# Patient Record
Sex: Male | Born: 1950 | Race: White | Hispanic: No | Marital: Married | Smoking: Former smoker
Health system: Southern US, Community
[De-identification: ages and names within clinical notes are randomized; demographics above are authoritative.]

## PROBLEM LIST (undated history)

## (undated) DIAGNOSIS — G43909 Migraine, unspecified, not intractable, without status migrainosus: Secondary | ICD-10-CM

## (undated) DIAGNOSIS — E785 Hyperlipidemia, unspecified: Secondary | ICD-10-CM

## (undated) DIAGNOSIS — I240 Acute coronary thrombosis not resulting in myocardial infarction: Secondary | ICD-10-CM

## (undated) DIAGNOSIS — R42 Dizziness and giddiness: Secondary | ICD-10-CM

## (undated) DIAGNOSIS — M1990 Unspecified osteoarthritis, unspecified site: Secondary | ICD-10-CM

## (undated) DIAGNOSIS — K219 Gastro-esophageal reflux disease without esophagitis: Secondary | ICD-10-CM

## (undated) DIAGNOSIS — N189 Chronic kidney disease, unspecified: Secondary | ICD-10-CM

## (undated) HISTORY — DX: Acute coronary thrombosis not resulting in myocardial infarction: I24.0

## (undated) HISTORY — DX: Hyperlipidemia, unspecified: E78.5

## (undated) HISTORY — PX: EYE SURGERY: SHX253

## (undated) HISTORY — DX: Unspecified osteoarthritis, unspecified site: M19.90

## (undated) HISTORY — DX: Chronic kidney disease, unspecified: N18.9

## (undated) HISTORY — DX: Gastro-esophageal reflux disease without esophagitis: K21.9

## (undated) HISTORY — DX: Dizziness and giddiness: R42

## (undated) HISTORY — DX: Migraine, unspecified, not intractable, without status migrainosus: G43.909

## (undated) HISTORY — PX: KNEE ARTHROSCOPY: SUR90

---

## 1997-07-21 ENCOUNTER — Emergency Department (HOSPITAL_COMMUNITY): Admission: EM | Admit: 1997-07-21 | Discharge: 1997-07-21 | Payer: Self-pay | Admitting: Emergency Medicine

## 2007-11-09 ENCOUNTER — Ambulatory Visit: Payer: Self-pay | Admitting: Internal Medicine

## 2009-03-16 ENCOUNTER — Ambulatory Visit: Payer: Self-pay | Admitting: Internal Medicine

## 2009-07-03 ENCOUNTER — Ambulatory Visit: Payer: Self-pay | Admitting: Internal Medicine

## 2009-09-29 ENCOUNTER — Ambulatory Visit: Payer: Self-pay | Admitting: Internal Medicine

## 2009-10-16 ENCOUNTER — Ambulatory Visit: Payer: Self-pay | Admitting: Internal Medicine

## 2009-11-09 ENCOUNTER — Ambulatory Visit: Payer: Self-pay | Admitting: Internal Medicine

## 2009-11-14 ENCOUNTER — Ambulatory Visit: Payer: Self-pay | Admitting: Internal Medicine

## 2012-10-05 ENCOUNTER — Ambulatory Visit: Payer: Self-pay | Admitting: Internal Medicine

## 2012-10-05 DIAGNOSIS — Z0289 Encounter for other administrative examinations: Secondary | ICD-10-CM

## 2012-10-23 ENCOUNTER — Ambulatory Visit: Payer: Self-pay | Admitting: Internal Medicine

## 2015-08-21 NOTE — Progress Notes (Signed)
Erroneous encounter

## 2016-12-24 LAB — IRON AND TIBC
IRON, TOTAL/TOTAL IRON BINDING CAP: 135
Iron Saturation: 42
TIBC: 319

## 2016-12-24 LAB — BASIC METABOLIC PANEL
BUN: 17 (ref 4–21)
Creatinine: 0.9 (ref <=1.3)
Glucose: 91
POTASSIUM: 4.2 (ref 3.4–5.3)
Sodium: 138 (ref 137–147)

## 2016-12-24 LAB — CBC AND DIFFERENTIAL
HCT: 47 (ref 41–53)
HEMOGLOBIN: 16.2 (ref 13.5–17.5)
Neutrophils Absolute: 3978
PLATELETS: 245 (ref 150–399)
WBC: 6

## 2016-12-24 LAB — TSH: TSH: 2.65 (ref <=5.90)

## 2016-12-24 LAB — HEPATIC FUNCTION PANEL
ALT: 21 (ref 10–40)
AST: 25 (ref 14–40)
Alkaline Phosphatase: 61 (ref 25–125)
Bilirubin, Total: 0.7

## 2016-12-24 LAB — LIPID PANEL
CHOLESTEROL: 225 — AB (ref 0–200)
HDL: 36 (ref 35–70)
LDL Cholesterol: 151
TRIGLYCERIDES: 250 — AB (ref 40–160)

## 2016-12-24 LAB — VITAMIN D 25 HYDROXY (VIT D DEFICIENCY, FRACTURES): Vit D, 25-Hydroxy: 20

## 2016-12-24 LAB — TESTOSTERONE TOTAL,FREE,BIO, MALES: TESTOSTERONE: 379

## 2016-12-24 LAB — HEMOGLOBIN A1C: Hemoglobin A1C: 5.3

## 2016-12-24 LAB — PSA: PSA: 2

## 2017-02-10 ENCOUNTER — Encounter: Payer: Self-pay | Admitting: Podiatry

## 2017-02-10 ENCOUNTER — Ambulatory Visit: Payer: BC Managed Care – PPO | Admitting: Podiatry

## 2017-02-10 ENCOUNTER — Ambulatory Visit (INDEPENDENT_AMBULATORY_CARE_PROVIDER_SITE_OTHER): Payer: BC Managed Care – PPO

## 2017-02-10 ENCOUNTER — Other Ambulatory Visit: Payer: Self-pay | Admitting: Podiatry

## 2017-02-10 VITALS — BP 135/82 | HR 71

## 2017-02-10 DIAGNOSIS — M779 Enthesopathy, unspecified: Secondary | ICD-10-CM | POA: Diagnosis not present

## 2017-02-10 DIAGNOSIS — M205X1 Other deformities of toe(s) (acquired), right foot: Secondary | ICD-10-CM

## 2017-02-10 DIAGNOSIS — R52 Pain, unspecified: Secondary | ICD-10-CM

## 2017-02-10 NOTE — Patient Instructions (Signed)
Start by changing your shoes. You can go to [employer] or [employer] to look at shoes. If this does not help we can add a custom insert in your shoes. We can do that for you in our office. If you get swelling or increase in pain that we can consider a steroid injection to help.   It was nice to meet you today. If you have any questions please give us a call at [phone]  Have a good week!

## 2017-02-10 NOTE — Progress Notes (Signed)
Subjective:    Patient ID: Martin Norris, male    DOB: 09-30-1950, 67 y.o.   MRN: [id number]  HPI 67 year old male presents the office today for concerns of a possible bunion to the right toe is been ongoing for about 2 years.  He states the pain is intermittent and is worse he does a lot of walking or standing.  He states that he cannot bend his toe back like he can his left foot.  He states that he may have broken it several years ago but no recent injury.  No numbness or tingling.  He does not there is any significant swelling or redness.  He has no other concerns today.   Review of Systems  All other systems reviewed and are negative.  No past medical history on file.     Current Outpatient Medications:  .  atorvastatin (LIPITOR) 10 MG tablet, , Disp: , Rfl: 3 .  Ibuprofen (ADVIL PO), Take by mouth 3 (three) times daily as needed., Disp: , Rfl:  .  Vitamin D, Ergocalciferol, (DRISDOL) 50000 units CAPS capsule, , Disp: , Rfl: 3  Allergies  Allergen Reactions  . Codeine     Anxious,intolerence,nervous,mainly cold medicine    Social History   Socioeconomic History  . Marital status: Single    Spouse name: Not on file  . Number of children: Not on file  . Years of education: Not on file  . Highest education level: Not on file  Social Needs  . Financial resource strain: Not on file  . Food insecurity - worry: Not on file  . Food insecurity - inability: Not on file  . Transportation needs - medical: Not on file  . Transportation needs - non-medical: Not on file  Occupational History  . Not on file  Tobacco Use  . Smoking status: Former Games developer  . Smokeless tobacco: Never Used  . Tobacco comment: quit 27 years ago  Substance and Sexual Activity  . Alcohol use: No    Frequency: Never  . Drug use: No  . Sexual activity: Not on file  Other Topics Concern  . Not on file  Social History Narrative  . Not on file        Objective:   Physical Exam  General: AAO  x3, NAD  Dermatological: Skin is warm, dry and supple bilateral. Nails x 10 are well manicured; remaining integument appears unremarkable at this time. There are no open sores, no preulcerative lesions, no rash or signs of infection present.  Vascular: Dorsalis Pedis artery and Posterior Tibial artery pedal pulses are 2/4 bilateral with immedate capillary fill time. Pedal hair growth present.  There is no pain with calf compression, swelling, warmth, erythema.   Neruologic: Grossly intact via light touch bilateral. Protective threshold with Semmes Wienstein monofilament intact to all pedal sites bilateral.   Musculoskeletal: There is decreased range of motion of the right first MTPJ and there is a dorsal bone spur present of the first metatarsal head.  There is trace edema to the MPJ but there is no erythema or increase in warmth.  There is no pain in the second metatarsal other areas of the foot or ankle.  Ankle, subtalar joint range of motion intact.  Muscular strength 5/5 in all groups tested bilateral.  Gait: Unassisted, Nonantalgic.     Assessment & Plan:  67 year old male hallux limitus, capsulitis right first MPJ -Treatment options discussed including all alternatives, risks, and complications -Etiology of symptoms were discussed -X-rays were  obtained and reviewed with the patient.  Arthritic changes present the first MTPJ with a dorsal osteophyte.  There is no evidence of acute fracture identified. -We discussed both conservative as well as surgical treatment options.  After discussion we will continue with conservative treatment.  We discussed a steroid injection he declined and I agree with this as he has minimal discomfort today.  Discussed with him a change in shoes in a more supportive, stiffer soled shoe.  We discussed a custom orthotic to help offload the symptoms.  He can start by changing his shoe is a very flexible old shoe.  If this is not helpful and start with an  insert.  Vivi BarrackMatthew R  DPM

## 2017-02-11 DIAGNOSIS — M205X1 Other deformities of toe(s) (acquired), right foot: Secondary | ICD-10-CM | POA: Insufficient documentation

## 2017-05-21 NOTE — Progress Notes (Signed)
Subjective:    Patient ID: Martin Norris, male    DOB: [birth year], 67 y.o.   MRN: [id number]  HPI:  Martin Norris is here to establish as a new pt.  Martin Norris is a pleasant 67 year old male.  PMH: HLD.  Martin Norris was previously on statin and after 20 days of therapy Martin Norris developed severe myalgia and short term memory loss. Martin Norris denies tobacco use Martin Norris denies excessive ETOH use Martin Norris is recently retired and plans on "farming most of my own food soon" Martin Norris denies cardiac/resp disease His problem list has CKD, however 12/24/16 GFR 91, creat 0.89  Patient Care Team    Relationship Specialty Notifications Start End  Julaine Fusi, NP PCP - General Family Medicine  05/22/17     Patient Active Problem List   Diagnosis Date Noted  . Healthcare maintenance 05/22/2017  . Hyperlipidemia 05/22/2017  . Hallux limitus of right foot 02/11/2017     Past Medical History:  Diagnosis Date  . Chronic kidney disease    KIDNEY STONES  . Hyperlipidemia      Past Surgical History:  Procedure Laterality Date  . KNEE ARTHROSCOPY Left      Family History  Problem Relation Age of Onset  . Healthy Mother   . Heart disease Father   . Stroke Father   . Hyperlipidemia Father   . COPD Sister   . Hypertension Brother   . Hyperlipidemia Brother   . Hypertension Sister   . Hyperlipidemia Brother      Social History   Substance and Sexual Activity  Drug Use Yes  . Types: Marijuana   Comment: CASUALLY     Social History   Substance and Sexual Activity  Alcohol Use Yes  . Frequency: Never   Comment: RARELY     Social History   Tobacco Use  Smoking Status Former Smoker  . Packs/day: 2.00  . Years: 30.00  . Pack years: 60.00  . Types: Cigarettes  . Last attempt to quit: 01/28/1989  . Years since quitting: 28.3  Smokeless Tobacco Never Used     Outpatient Encounter Medications as of 05/22/2017  Medication Sig  . Ibuprofen (ADVIL PO) Take by mouth 3 (three) times daily as needed.  . Red Yeast Rice  Extract 600 MG CAPS Take 2 capsules by mouth daily.  . Turmeric 500 MG CAPS Take 2 capsules by mouth daily.  . Vitamin D, Ergocalciferol, (DRISDOL) 50000 units CAPS capsule Take 50,000 Units by mouth every 7 (seven) days.   Marland Kitchen omega-3 acid ethyl esters (LOVAZA) 1 g capsule Take 2 capsules (2 g total) by mouth 2 (two) times daily.  . [DISCONTINUED] atorvastatin (LIPITOR) 10 MG tablet    No facility-administered encounter medications on file as of 05/22/2017.     Allergies: Codeine and Statins  Body mass index is 39.33 kg/m.  Blood pressure (!) 144/80, pulse 63, height 5' 5.5" (1.664 m), weight 240 lb (108.9 kg), SpO2 98 %.   Patient Care Team    Relationship Specialty Notifications Start End  Julaine Fusi, NP PCP - General Family Medicine  05/22/17     Patient Active Problem List   Diagnosis Date Noted  . Healthcare maintenance 05/22/2017  . Hyperlipidemia 05/22/2017  . Hallux limitus of right foot 02/11/2017     Past Medical History:  Diagnosis Date  . Chronic kidney disease    KIDNEY STONES  . Hyperlipidemia      Past Surgical History:  Procedure Laterality Date  .  KNEE ARTHROSCOPY Left      Family History  Problem Relation Age of Onset  . Healthy Mother   . Heart disease Father   . Stroke Father   . Hyperlipidemia Father   . COPD Sister   . Hypertension Brother   . Hyperlipidemia Brother   . Hypertension Sister   . Hyperlipidemia Brother      Social History   Substance and Sexual Activity  Drug Use Yes  . Types: Marijuana   Comment: CASUALLY     Social History   Substance and Sexual Activity  Alcohol Use Yes  . Frequency: Never   Comment: RARELY     Social History   Tobacco Use  Smoking Status Former Smoker  . Packs/day: 2.00  . Years: 30.00  . Pack years: 60.00  . Types: Cigarettes  . Last attempt to quit: 01/28/1989  . Years since quitting: 28.3  Smokeless Tobacco Never Used     Outpatient Encounter Medications as of  05/22/2017  Medication Sig  . Ibuprofen (ADVIL PO) Take by mouth 3 (three) times daily as needed.  . Red Yeast Rice Extract 600 MG CAPS Take 2 capsules by mouth daily.  . Turmeric 500 MG CAPS Take 2 capsules by mouth daily.  . Vitamin D, Ergocalciferol, (DRISDOL) 50000 units CAPS capsule Take 50,000 Units by mouth every 7 (seven) days.   Marland Kitchen omega-3 acid ethyl esters (LOVAZA) 1 g capsule Take 2 capsules (2 g total) by mouth 2 (two) times daily.  . [DISCONTINUED] atorvastatin (LIPITOR) 10 MG tablet    No facility-administered encounter medications on file as of 05/22/2017.     Allergies: Codeine and Statins  Body mass index is 39.33 kg/m.  Blood pressure (!) 144/80, pulse 63, height 5' 5.5" (1.664 m), weight 240 lb (108.9 kg), SpO2 98 %.      Review of Systems  Constitutional: Positive for fatigue. Negative for activity change, appetite change, chills, diaphoresis, fever and unexpected weight change.  Eyes: Negative for visual disturbance.  Respiratory: Negative for cough, chest tightness, shortness of breath, wheezing and stridor.   Cardiovascular: Negative for chest pain, palpitations and leg swelling.  Gastrointestinal: Negative for abdominal distention, abdominal pain, blood in stool, constipation, diarrhea, nausea and vomiting.  Endocrine: Negative for cold intolerance, heat intolerance, polydipsia, polyphagia and polyuria.  Genitourinary: Negative for difficulty urinating and frequency.  Musculoskeletal: Negative for arthralgias, back pain, gait problem, joint swelling, myalgias, neck pain and neck stiffness.  Neurological: Negative for dizziness and headaches.  Hematological: Does not bruise/bleed easily.  Psychiatric/Behavioral: Negative for dysphoric mood, hallucinations, self-injury, sleep disturbance and suicidal ideas. The patient is not nervous/anxious and is not hyperactive.       Objective:   Physical Exam  Constitutional: Martin Norris is oriented to person, place, and time.  Martin Norris appears well-developed and well-nourished. No distress.  HENT:  Head: Normocephalic and atraumatic.  Right Ear: External ear normal.  Eyes: Pupils are equal, round, and reactive to light. Conjunctivae are normal.  Neck: Normal range of motion. Neck supple.  Neurological: Martin Norris is alert and oriented to person, place, and time.  Skin: Skin is warm and dry. No rash noted. Martin Norris is not diaphoretic. No erythema. No pallor.  Psychiatric: Martin Norris has a normal mood and affect. His behavior is normal. Judgment and thought content normal.  Nursing note and vitals reviewed.         Assessment & Plan:   1. Need for Tdap vaccination   2. Screening for colon cancer  3. Healthcare maintenance   4. Mixed hyperlipidemia      Healthcare maintenance  Please start Lovaza, take as directed. Follow Mediterranean Diet and increase regular exercise.  Recommend at least 30 minutes daily, 5 days per week of walking, jogging, biking, swimming, YouTube/Pinterest workout videos, house/yard work. Continue Tumeric and Red Yeast Rice. Please schedule fasting lab appt in 5 months, then complete physical a week or so afterwards. Order for Colonoscopy placed.  Hyperlipidemia 12/24/16 Lipid Panel- Tot 225 TGs 250 HDL 36 LDL 151 Martin Norris is statin intolerant. Started on Lovaza BID Follow Mediterranean Diet and increase regular movement/exercise Will recheck lipids at end of summer- Martin Norris can normalize numbers with TLC    FOLLOW-UP:  Return in about 5 months (around 10/22/2017) for Fasting Labs, CPE.

## 2017-05-22 ENCOUNTER — Encounter: Payer: Self-pay | Admitting: Adult Health

## 2017-05-22 ENCOUNTER — Ambulatory Visit: Payer: BC Managed Care – PPO | Admitting: Adult Health

## 2017-05-22 VITALS — BP 144/80 | HR 63 | Ht 65.5 in | Wt 240.0 lb

## 2017-05-22 DIAGNOSIS — Z0000 Encounter for general adult medical examination without abnormal findings: Secondary | ICD-10-CM | POA: Diagnosis not present

## 2017-05-22 DIAGNOSIS — Z1211 Encounter for screening for malignant neoplasm of colon: Secondary | ICD-10-CM | POA: Diagnosis not present

## 2017-05-22 DIAGNOSIS — Z23 Encounter for immunization: Secondary | ICD-10-CM | POA: Diagnosis not present

## 2017-05-22 DIAGNOSIS — E785 Hyperlipidemia, unspecified: Secondary | ICD-10-CM | POA: Insufficient documentation

## 2017-05-22 DIAGNOSIS — E782 Mixed hyperlipidemia: Secondary | ICD-10-CM

## 2017-05-22 MED ORDER — OMEGA-3-ACID ETHYL ESTERS 1 G PO CAPS: 2.0000 g | ORAL_CAPSULE | Freq: Two times a day (BID) | ORAL | 2 refills | Status: DC

## 2017-05-22 NOTE — Assessment & Plan Note (Signed)
12/24/16 Lipid Panel- Tot 225 TGs 250 HDL 36 LDL 151 He is statin intolerant. Started on Lovaza BID Follow Mediterranean Diet and increase regular movement/exercise Will recheck lipids at end of summer- he can normalize numbers with TLC

## 2017-05-22 NOTE — Assessment & Plan Note (Signed)
  Please start Lovaza, take as directed. Follow Mediterranean Diet and increase regular exercise.  Recommend at least 30 minutes daily, 5 days per week of walking, jogging, biking, swimming, YouTube/Pinterest workout videos, house/yard work. Continue Tumeric and Red Yeast Rice. Please schedule fasting lab appt in 5 months, then complete physical a week or so afterwards. Order for Colonoscopy placed.

## 2017-05-22 NOTE — Patient Instructions (Signed)
Mediterranean Diet A Mediterranean diet refers to food and lifestyle choices that are based on the traditions of countries located on the [place]. This way of eating has been shown to help prevent certain conditions and improve outcomes for people who have chronic diseases, like kidney disease and heart disease. What are tips for following this plan? Lifestyle  Cook and eat meals together with your family, when possible.  Drink enough fluid to keep your urine clear or pale yellow.  Be physically active every day. This includes: ? Aerobic exercise like running or swimming. ? Leisure activities like gardening, walking, or housework.  Get 7-8 hours of sleep each night.  If recommended by your health care provider, drink red wine in moderation. This means 1 glass a day for nonpregnant women and 2 glasses a day for men. A glass of wine equals 5 oz (150 mL). Reading food labels  Check the serving size of packaged foods. For foods such as rice and pasta, the serving size refers to the amount of cooked product, not dry.  Check the total fat in packaged foods. Avoid foods that have saturated fat or trans fats.  Check the ingredients list for added sugars, such as corn syrup. Shopping  At the grocery store, buy most of your food from the areas near the walls of the store. This includes: ? Fresh fruits and vegetables (produce). ? Grains, beans, nuts, and seeds. Some of these may be available in unpackaged forms or large amounts (in bulk). ? Fresh seafood. ? Poultry and eggs. ? Low-fat dairy products.  Buy whole ingredients instead of prepackaged foods.  Buy fresh fruits and vegetables in-season from local farmers markets.  Buy frozen fruits and vegetables in resealable bags.  If you do not have access to quality fresh seafood, buy precooked frozen shrimp or canned fish, such as tuna, salmon, or sardines.  Buy small amounts of raw or cooked vegetables, salads, or olives from the  deli or salad bar at your store.  Stock your pantry so you always have certain foods on hand, such as olive oil, canned tuna, canned tomatoes, rice, pasta, and beans. Cooking  Cook foods with extra-virgin olive oil instead of using butter or other vegetable oils.  Have meat as a side dish, and have vegetables or grains as your main dish. This means having meat in small portions or adding small amounts of meat to foods like pasta or stew.  Use beans or vegetables instead of meat in common dishes like chili or lasagna.  Experiment with different cooking methods. Try roasting or broiling vegetables instead of steaming or sauteing them.  Add frozen vegetables to soups, stews, pasta, or rice.  Add nuts or seeds for added healthy fat at each meal. You can add these to yogurt, salads, or vegetable dishes.  Marinate fish or vegetables using olive oil, lemon juice, garlic, and fresh herbs. Meal planning  Plan to eat 1 vegetarian meal one day each week. Try to work up to 2 vegetarian meals, if possible.  Eat seafood 2 or more times a week.  Have healthy snacks readily available, such as: ? Vegetable sticks with hummus. ? Greek yogurt. ? Fruit and nut trail mix.  Eat balanced meals throughout the week. This includes: ? Fruit: 2-3 servings a day ? Vegetables: 4-5 servings a day ? Low-fat dairy: 2 servings a day ? Fish, poultry, or lean meat: 1 serving a day ? Beans and legumes: 2 or more servings a week ? Nuts   and seeds: 1-2 servings a day ? Whole grains: 6-8 servings a day ? Extra-virgin olive oil: 3-4 servings a day  Limit red meat and sweets to only a few servings a month What are my food choices?  Mediterranean diet ? Recommended ? Grains: Whole-grain pasta. Brown rice. Bulgar wheat. Polenta. Couscous. Whole-wheat bread. [place]. ? Vegetables: Artichokes. Beets. Broccoli. Cabbage. Carrots. Eggplant. Green beans. Chard. Kale. Spinach. Onions. Leeks. Peas. Squash.  Tomatoes. Peppers. Radishes. ? Fruits: Apples. Apricots. Avocado. Berries. Bananas. Cherries. Dates. Figs. Grapes. Lemons. Melon. Oranges. Peaches. Plums. Pomegranate. ? Meats and other protein foods: Beans. Almonds. Sunflower seeds. Pine nuts. Peanuts. [place]. Salmon. Scallops. Shrimp. [place]. Tilapia. Clams. Oysters. Eggs. ? Dairy: Low-fat milk. Cheese. Greek yogurt. ? Beverages: Water. Red wine. Herbal tea. ? Fats and oils: Extra virgin olive oil. Avocado oil. Grape seed oil. ? Sweets and desserts: [place] yogurt with honey. Baked apples. Poached pears. Trail mix. ? Seasoning and other foods: Basil. Cilantro. Coriander. Cumin. Mint. Parsley. Sage. Rosemary. Tarragon. Garlic. Oregano. Thyme. Pepper. Balsalmic vinegar. Tahini. Hummus. Tomato sauce. Olives. Mushrooms. ? Limit these ? Grains: Prepackaged pasta or rice dishes. Prepackaged cereal with added sugar. ? Vegetables: Deep fried potatoes (french fries). ? Fruits: Fruit canned in syrup. ? Meats and other protein foods: Beef. Pork. Lamb. Poultry with skin. Hot dogs. Berniece Salines. ? Dairy: Ice cream. Sour cream. Whole milk. ? Beverages: Juice. Sugar-sweetened soft drinks. Beer. Liquor and spirits. ? Fats and oils: Butter. Canola oil. Vegetable oil. Beef fat (tallow). Lard. ? Sweets and desserts: Cookies. Cakes. Pies. Candy. ? Seasoning and other foods: Mayonnaise. Premade sauces and marinades. ? The items listed may not be a complete list. Talk with your dietitian about what dietary choices are right for you. Summary  The Mediterranean diet includes both food and lifestyle choices.  Eat a variety of fresh fruits and vegetables, beans, nuts, seeds, and whole grains.  Limit the amount of red meat and sweets that you eat.  Talk with your health care provider about whether it is safe for you to drink red wine in moderation. This means 1 glass a day for nonpregnant women and 2 glasses a day for men. A glass of wine equals 5 oz (150 mL). This information  is not intended to replace advice given to you by your health care provider. Make sure you discuss any questions you have with your health care provider. Document Released: 09/07/2015 Document Revised: 10/10/2015 Document Reviewed: 09/07/2015 Elsevier Interactive Patient Education  2018 [employer].   Exercising to [employer] Exercising can help you to lose weight. In order to lose weight through exercise, you need to do vigorous-intensity exercise. You can tell that you are exercising with vigorous intensity if you are breathing very hard and fast and cannot hold a conversation while exercising. Moderate-intensity exercise helps to maintain your current weight. You can tell that you are exercising at a moderate level if you have a higher heart rate and faster breathing, but you are still able to hold a conversation. How often should I exercise? Choose an activity that you enjoy and set realistic goals. Your health care provider can help you to make an activity plan that works for you. Exercise regularly as directed by your health care provider. This may include:  Doing resistance training twice each week, such as: ? Push-ups. ? Sit-ups. ? Lifting weights. ? Using resistance bands.  Doing a given intensity of exercise for a given amount of time. Choose from these options: ? 150  minutes of moderate-intensity exercise every week. ? 75 minutes of vigorous-intensity exercise every week. ? A mix of moderate-intensity and vigorous-intensity exercise every week.  Children, pregnant women, people who are out of shape, people who are overweight, and older adults may need to consult a health care provider for individual recommendations. If you have any sort of medical condition, be sure to consult your health care provider before starting a new exercise program. What are some activities that can help me to lose weight?  Walking at a rate of at least 4.5 miles an hour.  Jogging or running at a  rate of 5 miles per hour.  Biking at a rate of at least 10 miles per hour.  Lap swimming.  Roller-skating or in-line skating.  Cross-country skiing.  Vigorous competitive sports, such as football, basketball, and soccer.  Jumping rope.  Aerobic dancing. How can I be more active in my day-to-day activities?  Use the stairs instead of the elevator.  Take a walk during your lunch break.  If you drive, park your car farther away from work or school.  If you take public transportation, get off one stop early and walk the rest of the way.  Make all of your phone calls while standing up and walking around.  Get up, stretch, and walk around every 30 minutes throughout the day. What guidelines should I follow while exercising?  Do not exercise so much that you hurt yourself, feel dizzy, or get very short of breath.  Consult your health care provider prior to starting a new exercise program.  Wear comfortable clothes and shoes with good support.  Drink plenty of water while you exercise to prevent dehydration or heat stroke. Body water is lost during exercise and must be replaced.  Work out until you breathe faster and your heart beats faster. This information is not intended to replace advice given to you by your health care provider. Make sure you discuss any questions you have with your health care provider. Document Released: 02/16/2010 Document Revised: 06/22/2015 Document Reviewed: 06/17/2013 Elsevier Interactive Patient Education  2018 [employer]   Please start Lovaza, take as directed. Follow Mediterranean Diet and increase regular exercise.  Recommend at least 30 minutes daily, 5 days per week of walking, jogging, biking, swimming, YouTube/Pinterest workout videos, house/yard work. Continue Tumeric and Red Yeast Rice. Please schedule fasting lab appt in 5 months, then complete physical a week or so afterwards. Order for Colonoscopy placed. WELCOME TO THE  PRACTICE!

## 2017-06-02 NOTE — Progress Notes (Signed)
Opened in error. T. Nelson, CMA 

## 2017-06-24 ENCOUNTER — Encounter: Payer: Self-pay | Admitting: Adult Health

## 2017-10-21 NOTE — Progress Notes (Signed)
Subjective:    Patient ID: Martin Norris, male    DOB: 04/20/1950, 67 y.o.   MRN: [id number]  HPI: 05/22/17 OV:  Martin Norris is here to establish as a new pt.  He is a pleasant 67 year old male.  PMH: HLD.  He was previously on statin and after 20 days of therapy he developed severe myalgia and short term memory loss. He denies tobacco use He denies excessive ETOH use He is recently retired and plans on "farming most of my own food soon" He denies cardiac/resp disease His problem list has CKD, however 12/24/16 GFR 91, creat 0.89  10/23/17 OV: Martin Norris is here for CPE He remains off statin due to myalgia's, and has been increasing daily walking and reducing saturated fat and CHO in diet to naturally lower lipids. He continue to abstain from tobacco and seldom consumes ETOH He is enjoying retirement and denies any acute complaints today.  Healthcare Maintenance: Colonoscopy-referral to GI has been made Immunizations-UTD LDCT-N/A AAA Screening-N/A  Patient Care Team    Relationship Specialty Notifications Start End  Julaine Fusi, NP PCP - General Family Medicine  05/22/17     Patient Active Problem List   Diagnosis Date Noted  . Healthcare maintenance 05/22/2017  . Hyperlipidemia 05/22/2017  . Hallux limitus of right foot 02/11/2017     Past Medical History:  Diagnosis Date  . Chronic kidney disease    KIDNEY STONES  . Hyperlipidemia      Past Surgical History:  Procedure Laterality Date  . KNEE ARTHROSCOPY Left      Family History  Problem Relation Age of Onset  . Healthy Mother   . Heart disease Father   . Stroke Father   . Hyperlipidemia Father   . COPD Sister   . Hypertension Brother   . Hyperlipidemia Brother   . Hypertension Sister   . Hyperlipidemia Brother      Social History   Substance and Sexual Activity  Drug Use Yes  . Types: Marijuana   Comment: CASUALLY     Social History   Substance and Sexual Activity  Alcohol Use Yes  .  Frequency: Never   Comment: RARELY     Social History   Tobacco Use  Smoking Status Former Smoker  . Packs/day: 2.00  . Years: 30.00  . Pack years: 60.00  . Types: Cigarettes  . Last attempt to quit: 01/28/1989  . Years since quitting: 28.7  Smokeless Tobacco Never Used     Outpatient Encounter Medications as of 10/23/2017  Medication Sig  . Ibuprofen (ADVIL PO) Take by mouth 3 (three) times daily as needed.  Marland Kitchen omega-3 acid ethyl esters (LOVAZA) 1 g capsule Take 2 capsules (2 g total) by mouth 2 (two) times daily.  . Red Yeast Rice Extract 600 MG CAPS Take 2 capsules by mouth daily.  . Turmeric 500 MG CAPS Take 2 capsules by mouth daily.  . [DISCONTINUED] Vitamin D, Ergocalciferol, (DRISDOL) 50000 units CAPS capsule Take 50,000 Units by mouth every 7 (seven) days.    No facility-administered encounter medications on file as of 10/23/2017.     Allergies: Codeine and Statins  Body mass index is 39.48 kg/m.  Blood pressure 127/81, pulse 61, height 5' 5.5" (1.664 m), weight 240 lb 14.4 oz (109.3 kg), SpO2 96 %.   Patient Care Team    Relationship Specialty Notifications Start End  William Hamburger D, NP PCP - General Family Medicine  05/22/17  Patient Active Problem List   Diagnosis Date Noted  . Healthcare maintenance 05/22/2017  . Hyperlipidemia 05/22/2017  . Hallux limitus of right foot 02/11/2017     Past Medical History:  Diagnosis Date  . Chronic kidney disease    KIDNEY STONES  . Hyperlipidemia      Past Surgical History:  Procedure Laterality Date  . KNEE ARTHROSCOPY Left      Family History  Problem Relation Age of Onset  . Healthy Mother   . Heart disease Father   . Stroke Father   . Hyperlipidemia Father   . COPD Sister   . Hypertension Brother   . Hyperlipidemia Brother   . Hypertension Sister   . Hyperlipidemia Brother      Social History   Substance and Sexual Activity  Drug Use Yes  . Types: Marijuana   Comment: CASUALLY      Social History   Substance and Sexual Activity  Alcohol Use Yes  . Frequency: Never   Comment: RARELY     Social History   Tobacco Use  Smoking Status Former Smoker  . Packs/day: 2.00  . Years: 30.00  . Pack years: 60.00  . Types: Cigarettes  . Last attempt to quit: 01/28/1989  . Years since quitting: 28.7  Smokeless Tobacco Never Used     Outpatient Encounter Medications as of 10/23/2017  Medication Sig  . Ibuprofen (ADVIL PO) Take by mouth 3 (three) times daily as needed.  Marland Kitchen. omega-3 acid ethyl esters (LOVAZA) 1 g capsule Take 2 capsules (2 g total) by mouth 2 (two) times daily.  . Red Yeast Rice Extract 600 MG CAPS Take 2 capsules by mouth daily.  . Turmeric 500 MG CAPS Take 2 capsules by mouth daily.  . [DISCONTINUED] Vitamin D, Ergocalciferol, (DRISDOL) 50000 units CAPS capsule Take 50,000 Units by mouth every 7 (seven) days.    No facility-administered encounter medications on file as of 10/23/2017.     Allergies: Codeine and Statins  Body mass index is 39.48 kg/m.  Blood pressure 127/81, pulse 61, height 5' 5.5" (1.664 m), weight 240 lb 14.4 oz (109.3 kg), SpO2 96 %.  Review of Systems  Constitutional: Positive for fatigue. Negative for activity change, appetite change, chills, diaphoresis, fever and unexpected weight change.  Eyes: Negative for visual disturbance.  Respiratory: Negative for cough, chest tightness, shortness of breath, wheezing and stridor.   Cardiovascular: Negative for chest pain, palpitations and leg swelling.  Gastrointestinal: Negative for abdominal distention, abdominal pain, blood in stool, constipation, diarrhea, nausea and vomiting.  Endocrine: Negative for cold intolerance, heat intolerance, polydipsia, polyphagia and polyuria.  Genitourinary: Negative for difficulty urinating and frequency.  Musculoskeletal: Negative for arthralgias, back pain, gait problem, joint swelling, myalgias, neck pain and neck stiffness.  Neurological:  Negative for dizziness and headaches.  Hematological: Does not bruise/bleed easily.  Psychiatric/Behavioral: Negative for dysphoric mood, hallucinations, self-injury, sleep disturbance and suicidal ideas. The patient is not nervous/anxious and is not hyperactive.       Objective:   Physical Exam  Constitutional: He is oriented to person, place, and time. He appears well-developed and well-nourished. No distress.  HENT:  Head: Normocephalic and atraumatic.  Right Ear: Hearing, tympanic membrane, external ear and ear canal normal. Tympanic membrane is not erythematous and not bulging. No decreased hearing is noted.  Left Ear: Hearing, tympanic membrane, external ear and ear canal normal. Tympanic membrane is not erythematous and not bulging. No decreased hearing is noted.  Nose: No mucosal edema or  rhinorrhea. Right sinus exhibits no maxillary sinus tenderness and no frontal sinus tenderness. Left sinus exhibits no maxillary sinus tenderness and no frontal sinus tenderness.  Mouth/Throat: Uvula is midline and oropharynx is clear and moist.  Eyes: Pupils are equal, round, and reactive to light. Conjunctivae are normal.  Neck: Normal range of motion. Neck supple.  Cardiovascular: Normal rate, regular rhythm, normal heart sounds and intact distal pulses.  No murmur heard. Pulmonary/Chest: Effort normal. No respiratory distress. He has no wheezes. He has no rales. He exhibits no tenderness.  Abdominal: Soft. Bowel sounds are normal. He exhibits no distension and no mass. There is no tenderness. There is no rebound and no guarding.  Genitourinary:  Genitourinary Comments: Declined DRE/genital exam- had one 6 months ago it was normal per pt  Musculoskeletal: Normal range of motion. He exhibits no edema or tenderness.  Lymphadenopathy:    He has no cervical adenopathy.  Neurological: He is alert and oriented to person, place, and time. Coordination normal.  Skin: Skin is warm and dry. No rash noted.  He is not diaphoretic. No erythema. No pallor.  Psychiatric: He has a normal mood and affect. His behavior is normal. Judgment and thought content normal.  Nursing note and vitals reviewed.     Assessment & Plan:   1. Need for hepatitis C screening test   2. Healthcare maintenance   3. Mixed hyperlipidemia      Healthcare maintenance Continue to drink plenty of water and follow Mediterranean diet. We will call you when lab results are available. Please schedule your Colonoscopy soon. Recommend follow-up in 6 months, sooner if needed.   Hyperlipidemia Lipids drawn today He was previously on statin and after 20 days of therapy he developed severe myalgia and short term memory loss.    FOLLOW-UP:  Return in about 6 months (around 04/23/2018) for Regular Follow Up.

## 2017-10-23 ENCOUNTER — Encounter: Payer: Self-pay | Admitting: Adult Health

## 2017-10-23 ENCOUNTER — Ambulatory Visit (INDEPENDENT_AMBULATORY_CARE_PROVIDER_SITE_OTHER): Payer: BC Managed Care – PPO | Admitting: Adult Health

## 2017-10-23 VITALS — BP 127/81 | HR 61 | Ht 65.5 in | Wt 240.9 lb

## 2017-10-23 DIAGNOSIS — Z0000 Encounter for general adult medical examination without abnormal findings: Secondary | ICD-10-CM

## 2017-10-23 DIAGNOSIS — Z1159 Encounter for screening for other viral diseases: Secondary | ICD-10-CM

## 2017-10-23 DIAGNOSIS — E782 Mixed hyperlipidemia: Secondary | ICD-10-CM | POA: Diagnosis not present

## 2017-10-23 NOTE — Patient Instructions (Signed)
Preventive Care for Adults, Male A healthy lifestyle and preventive care can promote health and wellness. Preventive health guidelines for men include the following key practices:  A routine yearly physical is a good way to check with your health care provider about your health and preventative screening. It is a chance to share any concerns and updates on your health and to receive a thorough exam.  Visit your dentist for a routine exam and preventative care every 6 months. Brush your teeth twice a day and floss once a day. Good oral hygiene prevents tooth decay and gum disease.  The frequency of eye exams is based on your age, health, family medical history, use of contact lenses, and other factors. Follow your health care provider's recommendations for frequency of eye exams.  Eat a healthy diet. Foods such as vegetables, fruits, whole grains, low-fat dairy products, and lean protein foods contain the nutrients you need without too many calories. Decrease your intake of foods high in solid fats, added sugars, and salt. Eat the right amount of calories for you.Get information about a proper diet from your health care provider, if necessary.  Regular physical exercise is one of the most important things you can do for your health. Most adults should get at least 150 minutes of moderate-intensity exercise (any activity that increases your heart rate and causes you to sweat) each week. In addition, most adults need muscle-strengthening exercises on 2 or more days a week.  Maintain a healthy weight. The body mass index (BMI) is a screening tool to identify possible weight problems. It provides an estimate of body fat based on height and weight. Your health care provider can find your BMI and can help you achieve or maintain a healthy weight.For adults 20 years and older:  A BMI below 18.5 is considered underweight.  A BMI of 18.5 to 24.9 is normal.  A BMI of 25 to 29.9 is considered  overweight.  A BMI of 30 and above is considered obese.  Maintain normal blood lipids and cholesterol levels by exercising and minimizing your intake of saturated fat. Eat a balanced diet with plenty of fruit and vegetables. Blood tests for lipids and cholesterol should begin at age 22 and be repeated every 5 years. If your lipid or cholesterol levels are high, you are over 50, or you are at high risk for heart disease, you may need your cholesterol levels checked more frequently.Ongoing high lipid and cholesterol levels should be treated with medicines if diet and exercise are not working.  If you smoke, find out from your health care provider how to quit. If you do not use tobacco, do not start.  Lung cancer screening is recommended for adults aged 87-80 years who are at high risk for developing lung cancer because of a history of smoking. A yearly low-dose CT scan of the lungs is recommended for people who have at least a 30-pack-year history of smoking and are a current smoker or have quit within the past 15 years. A pack year of smoking is smoking an average of 1 pack of cigarettes a day for 1 year (for example: 1 pack a day for 30 years or 2 packs a day for 15 years). Yearly screening should continue until the smoker has stopped smoking for at least 15 years. Yearly screening should be stopped for people who develop a health problem that would prevent them from having lung cancer treatment.  If you choose to drink alcohol, do not have more  than 2 drinks per day. One drink is considered to be 12 ounces (355 mL) of beer, 5 ounces (148 mL) of wine, or 1.5 ounces (44 mL) of liquor.  Avoid use of street drugs. Do not share needles with anyone. Ask for help if you need support or instructions about stopping the use of drugs.  High blood pressure causes heart disease and increases the risk of stroke. Your blood pressure should be checked at least every 1-2 years. Ongoing high blood pressure should be  treated with medicines, if weight loss and exercise are not effective.  If you are 34-90 years old, ask your health care provider if you should take aspirin to prevent heart disease.  Diabetes screening is done by taking a blood sample to check your blood glucose level after you have not eaten for a certain period of time (fasting). If you are not overweight and you do not have risk factors for diabetes, you should be screened once every 3 years starting at age 35. If you are overweight or obese and you are 70-84 years of age, you should be screened for diabetes every year as part of your cardiovascular risk assessment.  Colorectal cancer can be detected and often prevented. Most routine colorectal cancer screening begins at the age of 18 and continues through age 69. However, your health care provider may recommend screening at an earlier age if you have risk factors for colon cancer. On a yearly basis, your health care provider may provide home test kits to check for hidden blood in the stool. Use of a small camera at the end of a tube to directly examine the colon (sigmoidoscopy or colonoscopy) can detect the earliest forms of colorectal cancer. Talk to your health care provider about this at age 71, when routine screening begins. Direct exam of the colon should be repeated every 5-10 years through age 18, unless early forms of precancerous polyps or small growths are found.  People who are at an increased risk for hepatitis B should be screened for this virus. You are considered at high risk for hepatitis B if:  You were born in a country where hepatitis B occurs often. Talk with your health care provider about which countries are considered high risk.  Your parents were born in a high-risk country and you have not received a shot to protect against hepatitis B (hepatitis B vaccine).  You have HIV or AIDS.  You use needles to inject street drugs.  You live with, or have sex with, someone who  has hepatitis B.  You are a man who has sex with other men (MSM).  You get hemodialysis treatment.  You take certain medicines for conditions such as cancer, organ transplantation, and autoimmune conditions.  Hepatitis C blood testing is recommended for all people born from [birth year] through 1965 and any individual with known risks for hepatitis C.  Practice safe sex. Use condoms and avoid high-risk sexual practices to reduce the spread of sexually transmitted infections (STIs). STIs include gonorrhea, chlamydia, syphilis, trichomonas, herpes, HPV, and human immunodeficiency virus (HIV). Herpes, HIV, and HPV are viral illnesses that have no cure. They can result in disability, cancer, and death.  If you are a man who has sex with other men, you should be screened at least once per year for:  HIV.  Urethral, rectal, and pharyngeal infection of gonorrhea, chlamydia, or both.  If you are at risk of being infected with HIV, it is recommended that you take a  prescription medicine daily to prevent HIV infection. This is called preexposure prophylaxis (PrEP). You are considered at risk if:  You are a man who has sex with other men (MSM) and have other risk factors.  You are a heterosexual man, are sexually active, and are at increased risk for HIV infection.  You take drugs by injection.  You are sexually active with a partner who has HIV.  Talk with your health care provider about whether you are at high risk of being infected with HIV. If you choose to begin PrEP, you should first be tested for HIV. You should then be tested every 3 months for as long as you are taking PrEP.  A one-time screening for abdominal aortic aneurysm (AAA) and surgical repair of large AAAs by ultrasound are recommended for men ages 44 to 66 years who are current or former smokers.  Healthy men should no longer receive prostate-specific antigen (PSA) blood tests as part of routine cancer screening. Talk with your health  care provider about prostate cancer screening.  Testicular cancer screening is not recommended for adult males who have no symptoms. Screening includes self-exam, a health care provider exam, and other screening tests. Consult with your health care provider about any symptoms you have or any concerns you have about testicular cancer.  Use sunscreen. Apply sunscreen liberally and repeatedly throughout the day. You should seek shade when your shadow is shorter than you. Protect yourself by wearing long sleeves, pants, a wide-brimmed hat, and sunglasses year round, whenever you are outdoors.  Once a month, do a whole-body skin exam, using a mirror to look at the skin on your back. Tell your health care provider about new moles, moles that have irregular borders, moles that are larger than a pencil eraser, or moles that have changed in shape or color.  Stay current with required vaccines (immunizations).  Influenza vaccine. All adults should be immunized every year.  Tetanus, diphtheria, and acellular pertussis (Td, Tdap) vaccine. An adult who has not previously received Tdap or who does not know his vaccine status should receive 1 dose of Tdap. This initial dose should be followed by tetanus and diphtheria toxoids (Td) booster doses every 10 years. Adults with an unknown or incomplete history of completing a 3-dose immunization series with Td-containing vaccines should begin or complete a primary immunization series including a Tdap dose. Adults should receive a Td booster every 10 years.  Varicella vaccine. An adult without evidence of immunity to varicella should receive 2 doses or a second dose if he has previously received 1 dose.  Human papillomavirus (HPV) vaccine. Males aged 11-21 years who have not received the vaccine previously should receive the 3-dose series. Males aged 22-26 years may be immunized. Immunization is recommended through the age of 23 years for any male who has sex with males  and did not get any or all doses earlier. Immunization is recommended for any person with an immunocompromised condition through the age of 72 years if he did not get any or all doses earlier. During the 3-dose series, the second dose should be obtained 4-8 weeks after the first dose. The third dose should be obtained 24 weeks after the first dose and 16 weeks after the second dose.  Zoster vaccine. One dose is recommended for adults aged 23 years or older unless certain conditions are present.  Measles, mumps, and rubella (MMR) vaccine. Adults born before [birth year] generally are considered immune to measles and mumps. Adults born in [birth year]  or later should have 1 or more doses of MMR vaccine unless there is a contraindication to the vaccine or there is laboratory evidence of immunity to each of the three diseases. A routine second dose of MMR vaccine should be obtained at least 28 days after the first dose for students attending postsecondary schools, health care workers, or international travelers. People who received inactivated measles vaccine or an unknown type of measles vaccine during 1963-1967 should receive 2 doses of MMR vaccine. People who received inactivated mumps vaccine or an unknown type of mumps vaccine before 1979 and are at high risk for mumps infection should consider immunization with 2 doses of MMR vaccine. Unvaccinated health care workers born before [birth year] who lack laboratory evidence of measles, mumps, or rubella immunity or laboratory confirmation of disease should consider measles and mumps immunization with 2 doses of MMR vaccine or rubella immunization with 1 dose of MMR vaccine.  Pneumococcal 13-valent conjugate (PCV13) vaccine. When indicated, a person who is uncertain of his immunization history and has no record of immunization should receive the PCV13 vaccine. All adults 9 years of age and older should receive this vaccine. An adult aged 69 years or older who has certain medical  conditions and has not been previously immunized should receive 1 dose of PCV13 vaccine. This PCV13 should be followed with a dose of pneumococcal polysaccharide (PPSV23) vaccine. Adults who are at high risk for pneumococcal disease should obtain the PPSV23 vaccine at least 8 weeks after the dose of PCV13 vaccine. Adults older than 65 years of age who have normal immune system function should obtain the PPSV23 vaccine dose at least 1 year after the dose of PCV13 vaccine.  Pneumococcal polysaccharide (PPSV23) vaccine. When PCV13 is also indicated, PCV13 should be obtained first. All adults aged 79 years and older should be immunized. An adult younger than age 43 years who has certain medical conditions should be immunized. Any person who resides in a nursing home or long-term care facility should be immunized. An adult smoker should be immunized. People with an immunocompromised condition and certain other conditions should receive both PCV13 and PPSV23 vaccines. People with human immunodeficiency virus (HIV) infection should be immunized as soon as possible after diagnosis. Immunization during chemotherapy or radiation therapy should be avoided. Routine use of PPSV23 vaccine is not recommended for American Indians, [place] Natives, or people younger than 65 years unless there are medical conditions that require PPSV23 vaccine. When indicated, people who have unknown immunization and have no record of immunization should receive PPSV23 vaccine. One-time revaccination 5 years after the first dose of PPSV23 is recommended for people aged 19-64 years who have chronic kidney failure, nephrotic syndrome, asplenia, or immunocompromised conditions. People who received 1-2 doses of PPSV23 before age 70 years should receive another dose of PPSV23 vaccine at age 79 years or later if at least 5 years have passed since the previous dose. Doses of PPSV23 are not needed for people immunized with PPSV23 at or after age 55  years.  Meningococcal vaccine. Adults with asplenia or persistent complement component deficiencies should receive 2 doses of quadrivalent meningococcal conjugate (MenACWY-D) vaccine. The doses should be obtained at least 2 months apart. Microbiologists working with certain meningococcal bacteria, [employer] recruits, people at risk during an outbreak, and people who travel to or live in countries with a high rate of meningitis should be immunized. A first-year college student up through age 64 years who is living in a residence hall should receive a  dose if he did not receive a dose on or after his 16th birthday. Adults who have certain high-risk conditions should receive one or more doses of vaccine.  Hepatitis A vaccine. Adults who wish to be protected from this disease, have chronic liver disease, work with hepatitis A-infected animals, work in hepatitis A research labs, or travel to or work in countries with a high rate of hepatitis A should be immunized. Adults who were previously unvaccinated and who anticipate close contact with an international adoptee during the first 60 days after arrival in the [place] from a country with a high rate of hepatitis A should be immunized.  Hepatitis B vaccine. Adults should be immunized if they wish to be protected from this disease, are under age 67 years and have diabetes, have chronic liver disease, have had more than one sex partner in the past 6 months, may be exposed to blood or other infectious body fluids, are household contacts or sex partners of hepatitis B positive people, are clients or workers in certain care facilities, or travel to or work in countries with a high rate of hepatitis B.  Haemophilus influenzae type b (Hib) vaccine. A previously unvaccinated person with asplenia or sickle cell disease or having a scheduled splenectomy should receive 1 dose of Hib vaccine. Regardless of previous immunization, a recipient of a hematopoietic stem cell  transplant should receive a 3-dose series 6-12 months after his successful transplant. Hib vaccine is not recommended for adults with HIV infection. Preventive Service / Frequency Ages 38 to 56  Blood pressure check.** / Every 3-5 years.  Lipid and cholesterol check.** / Every 5 years beginning at age 65.  Hepatitis C blood test.** / For any individual with known risks for hepatitis C.  Skin self-exam. / Monthly.  Influenza vaccine. / Every year.  Tetanus, diphtheria, and acellular pertussis (Tdap, Td) vaccine.** / Consult your health care provider. 1 dose of Td every 10 years.  Varicella vaccine.** / Consult your health care provider.  HPV vaccine. / 3 doses over 6 months, if 46 or younger.  Measles, mumps, rubella (MMR) vaccine.** / You need at least 1 dose of MMR if you were born in 1957 or later. You may also need a second dose.  Pneumococcal 13-valent conjugate (PCV13) vaccine.** / Consult your health care provider.  Pneumococcal polysaccharide (PPSV23) vaccine.** / 1 to 2 doses if you smoke cigarettes or if you have certain conditions.  Meningococcal vaccine.** / 1 dose if you are age 3 to 51 years and a Market researcher living in a residence hall, or have one of several medical conditions. You may also need additional booster doses.  Hepatitis A vaccine.** / Consult your health care provider.  Hepatitis B vaccine.** / Consult your health care provider.  Haemophilus influenzae type b (Hib) vaccine.** / Consult your health care provider. Ages 40 to 40  Blood pressure check.** / Every year.  Lipid and cholesterol check.** / Every 5 years beginning at age 66.  Lung cancer screening. / Every year if you are aged 62-80 years and have a 30-pack-year history of smoking and currently smoke or have quit within the past 15 years. Yearly screening is stopped once you have quit smoking for at least 15 years or develop a health problem that would prevent you from having  lung cancer treatment.  Fecal occult blood test (FOBT) of stool. / Every year beginning at age 85 and continuing until age 68. You may not have to do  this test if you get a colonoscopy every 10 years.  Flexible sigmoidoscopy** or colonoscopy.** / Every 5 years for a flexible sigmoidoscopy or every 10 years for a colonoscopy beginning at age 51 and continuing until age 64.  Hepatitis C blood test.** / For all people born from [birth year] through 1965 and any individual with known risks for hepatitis C.  Skin self-exam. / Monthly.  Influenza vaccine. / Every year.  Tetanus, diphtheria, and acellular pertussis (Tdap/Td) vaccine.** / Consult your health care provider. 1 dose of Td every 10 years.  Varicella vaccine.** / Consult your health care provider.  Zoster vaccine.** / 1 dose for adults aged 70 years or older.  Measles, mumps, rubella (MMR) vaccine.** / You need at least 1 dose of MMR if you were born in 1957 or later. You may also need a second dose.  Pneumococcal 13-valent conjugate (PCV13) vaccine.** / Consult your health care provider.  Pneumococcal polysaccharide (PPSV23) vaccine.** / 1 to 2 doses if you smoke cigarettes or if you have certain conditions.  Meningococcal vaccine.** / Consult your health care provider.  Hepatitis A vaccine.** / Consult your health care provider.  Hepatitis B vaccine.** / Consult your health care provider.  Haemophilus influenzae type b (Hib) vaccine.** / Consult your health care provider. Ages 43 and over  Blood pressure check.** / Every year.  Lipid and cholesterol check.**/ Every 5 years beginning at age 6.  Lung cancer screening. / Every year if you are aged 54-80 years and have a 30-pack-year history of smoking and currently smoke or have quit within the past 15 years. Yearly screening is stopped once you have quit smoking for at least 15 years or develop a health problem that would prevent you from having lung cancer treatment.  Fecal  occult blood test (FOBT) of stool. / Every year beginning at age 77 and continuing until age 32. You may not have to do this test if you get a colonoscopy every 10 years.  Flexible sigmoidoscopy** or colonoscopy.** / Every 5 years for a flexible sigmoidoscopy or every 10 years for a colonoscopy beginning at age 8 and continuing until age 60.  Hepatitis C blood test.** / For all people born from [birth year] through 1963 and any individual with known risks for hepatitis C.  Abdominal aortic aneurysm (AAA) screening.** / A one-time screening for ages 60 to 11 years who are current or former smokers.  Skin self-exam. / Monthly.  Influenza vaccine. / Every year.  Tetanus, diphtheria, and acellular pertussis (Tdap/Td) vaccine.** / 1 dose of Td every 10 years.  Varicella vaccine.** / Consult your health care provider.  Zoster vaccine.** / 1 dose for adults aged 9 years or older.  Pneumococcal 13-valent conjugate (PCV13) vaccine.** / 1 dose for all adults aged 91 years and older.  Pneumococcal polysaccharide (PPSV23) vaccine.** / 1 dose for all adults aged 96 years and older.  Meningococcal vaccine.** / Consult your health care provider.  Hepatitis A vaccine.** / Consult your health care provider.  Hepatitis B vaccine.** / Consult your health care provider.  Haemophilus influenzae type b (Hib) vaccine.** / Consult your health care provider. **Family history and personal history of risk and conditions may change your health care provider's recommendations.   This information is not intended to replace advice given to you by your health care provider. Make sure you discuss any questions you have with your health care provider.   Document Released: 03/12/2001 Document Revised: 02/04/2014 Document Reviewed: 06/11/2010 Elsevier Interactive Patient Education 2016  [place] refers to food and lifestyle choices that are based on the traditions of  countries located on the [employer]. This way of eating has been shown to help prevent certain conditions and improve outcomes for people who have chronic diseases, like kidney disease and heart disease. What are tips for following this plan? Lifestyle  Cook and eat meals together with your family, when possible.  Drink enough fluid to keep your urine clear or pale yellow.  Be physically active every day. This includes: ? Aerobic exercise like running or swimming. ? Leisure activities like gardening, walking, or housework.  Get 7-8 hours of sleep each night.  If recommended by your health care provider, drink red wine in moderation. This means 1 glass a day for nonpregnant women and 2 glasses a day for men. A glass of wine equals 5 oz (150 mL). Reading food labels  Check the serving size of packaged foods. For foods such as rice and pasta, the serving size refers to the amount of cooked product, not dry.  Check the total fat in packaged foods. Avoid foods that have saturated fat or trans fats.  Check the ingredients list for added sugars, such as corn syrup. Shopping  At the grocery store, buy most of your food from the areas near the walls of the store. This includes: ? Fresh fruits and vegetables (produce). ? Grains, beans, nuts, and seeds. Some of these may be available in unpackaged forms or large amounts (in bulk). ? Fresh seafood. ? Poultry and eggs. ? Low-fat dairy products.  Buy whole ingredients instead of prepackaged foods.  Buy fresh fruits and vegetables in-season from local farmers markets.  Buy frozen fruits and vegetables in resealable bags.  If you do not have access to quality fresh seafood, buy precooked frozen shrimp or canned fish, such as tuna, salmon, or sardines.  Buy small amounts of raw or cooked vegetables, salads, or olives from the deli or salad bar at your store.  Stock your pantry so you always have certain foods on hand, such as olive  oil, canned tuna, canned tomatoes, rice, pasta, and beans. Cooking  Cook foods with extra-virgin olive oil instead of using butter or other vegetable oils.  Have meat as a side dish, and have vegetables or grains as your main dish. This means having meat in small portions or adding small amounts of meat to foods like pasta or stew.  Use beans or vegetables instead of meat in common dishes like chili or lasagna.  Experiment with different cooking methods. Try roasting or broiling vegetables instead of steaming or sauteing them.  Add frozen vegetables to soups, stews, pasta, or rice.  Add nuts or seeds for added healthy fat at each meal. You can add these to yogurt, salads, or vegetable dishes.  Marinate fish or vegetables using olive oil, lemon juice, garlic, and fresh herbs. Meal planning  Plan to eat 1 vegetarian meal one day each week. Try to work up to 2 vegetarian meals, if possible.  Eat seafood 2 or more times a week.  Have healthy snacks readily available, such as: ? Vegetable sticks with hummus. ? [place] yogurt. ? Fruit and nut trail mix.  Eat balanced meals throughout the week. This includes: ? Fruit: 2-3 servings a day ? Vegetables: 4-5 servings a day ? Low-fat dairy: 2 servings a day ? Fish, poultry, or lean meat: 1 serving a day ? Beans and legumes: 2 or more  servings a week ? Nuts and seeds: 1-2 servings a day ? Whole grains: 6-8 servings a day ? Extra-virgin olive oil: 3-4 servings a day  Limit red meat and sweets to only a few servings a month What are my food choices?  Mediterranean diet ? Recommended ? Grains: Whole-grain pasta. Brown rice. Bulgar wheat. Polenta. Couscous. Whole-wheat bread. [place]. ? Vegetables: Artichokes. Beets. Broccoli. Cabbage. Carrots. Eggplant. Green beans. Chard. Kale. Spinach. Onions. Leeks. Peas. Squash. Tomatoes. Peppers. Radishes. ? Fruits: Apples. Apricots. Avocado. Berries. Bananas. Cherries. Dates. Figs. Grapes.  Lemons. Melon. Oranges. Peaches. Plums. Pomegranate. ? Meats and other protein foods: Beans. Almonds. Sunflower seeds. Pine nuts. Peanuts. Garden. Salmon. Scallops. Shrimp. [place]. Tilapia. Clams. Oysters. Eggs. ? Dairy: Low-fat milk. Cheese. Greek yogurt. ? Beverages: Water. Red wine. Herbal tea. ? Fats and oils: Extra virgin olive oil. Avocado oil. Grape seed oil. ? Sweets and desserts: [place] yogurt with honey. Baked apples. Poached pears. Trail mix. ? Seasoning and other foods: Basil. Cilantro. Coriander. Cumin. Mint. Parsley. Sage. Rosemary. Tarragon. Garlic. Oregano. Thyme. Pepper. Balsalmic vinegar. Tahini. Hummus. Tomato sauce. Olives. Mushrooms. ? Limit these ? Grains: Prepackaged pasta or rice dishes. Prepackaged cereal with added sugar. ? Vegetables: Deep fried potatoes (french fries). ? Fruits: Fruit canned in syrup. ? Meats and other protein foods: Beef. Pork. Lamb. Poultry with skin. Hot dogs. [place] Salines. ? Dairy: Ice cream. Sour cream. Whole milk. ? Beverages: Juice. Sugar-sweetened soft drinks. Beer. Liquor and spirits. ? Fats and oils: Butter. Canola oil. Vegetable oil. Beef fat (tallow). Lard. ? Sweets and desserts: Cookies. Cakes. Pies. Candy. ? Seasoning and other foods: Mayonnaise. Premade sauces and marinades. ? The items listed may not be a complete list. Talk with your dietitian about what dietary choices are right for you. Summary  The Mediterranean diet includes both food and lifestyle choices.  Eat a variety of fresh fruits and vegetables, beans, nuts, seeds, and whole grains.  Limit the amount of red meat and sweets that you eat.  Talk with your health care provider about whether it is safe for you to drink red wine in moderation. This means 1 glass a day for nonpregnant women and 2 glasses a day for men. A glass of wine equals 5 oz (150 mL). This information is not intended to replace advice given to you by your health care provider. Make sure you discuss any questions  you have with your health care provider. Document Released: 09/07/2015 Document Revised: 10/10/2015 Document Reviewed: 09/07/2015 Elsevier Interactive Patient Education  2018 [place] to drink plenty of water and follow Mediterranean diet. We will call you when lab results are available. Please schedule your Colonoscopy soon. Recommend follow-up in 6 months, sooner if needed.  NICE TO SEE YOU!

## 2017-10-23 NOTE — Assessment & Plan Note (Signed)
Continue to drink plenty of water and follow Mediterranean diet. We will call you when lab results are available. Please schedule your Colonoscopy soon. Recommend follow-up in 6 months, sooner if needed.

## 2017-10-23 NOTE — Assessment & Plan Note (Signed)
Lipids drawn today He was previously on statin and after 20 days of therapy he developed severe myalgia and short term memory loss.

## 2017-10-24 LAB — COMPREHENSIVE METABOLIC PANEL
A/G RATIO: 2.2 (ref 1.2–2.2)
ALK PHOS: 63 IU/L (ref 39–117)
ALT: 20 IU/L (ref 0–44)
AST: 25 IU/L (ref 0–40)
Albumin: 4.3 g/dL (ref 3.6–4.8)
BUN/Creatinine Ratio: 17 (ref 10–24)
BUN: 17 mg/dL (ref 8–27)
Bilirubin Total: 0.6 mg/dL (ref 0.0–1.2)
CALCIUM: 9.1 mg/dL (ref 8.6–10.2)
CO2: 24 mmol/L (ref 20–29)
CREATININE: 1.01 mg/dL (ref 0.76–1.27)
Chloride: 103 mmol/L (ref 96–106)
GFR calc Af Amer: 90 mL/min/{1.73_m2} (ref >59)
GFR calc non Af Amer: 78 mL/min/{1.73_m2} (ref >59)
GLOBULIN, TOTAL: 2 g/dL (ref 1.5–4.5)
Glucose: 99 mg/dL (ref 65–99)
POTASSIUM: 4.3 mmol/L (ref 3.5–5.2)
SODIUM: 142 mmol/L (ref 134–144)
Total Protein: 6.3 g/dL (ref 6.0–8.5)

## 2017-10-24 LAB — CBC WITH DIFFERENTIAL/PLATELET
Basophils Absolute: 0.1 10*3/uL (ref 0.0–0.2)
Basos: 1 %
EOS (ABSOLUTE): 0.2 10*3/uL (ref 0.0–0.4)
Eos: 4 %
HEMOGLOBIN: 16.3 g/dL (ref 13.0–17.7)
Hematocrit: 48.2 % (ref 37.5–51.0)
Immature Grans (Abs): 0 10*3/uL (ref 0.0–0.1)
Immature Granulocytes: 0 %
LYMPHS ABS: 1.3 10*3/uL (ref 0.7–3.1)
Lymphs: 23 %
MCH: 29.2 pg (ref 26.6–33.0)
MCHC: 33.8 g/dL (ref 31.5–35.7)
MCV: 86 fL (ref 79–97)
MONOCYTES: 8 %
MONOS ABS: 0.4 10*3/uL (ref 0.1–0.9)
NEUTROS ABS: 3.5 10*3/uL (ref 1.4–7.0)
Neutrophils: 64 %
Platelets: 241 10*3/uL (ref 150–450)
RBC: 5.59 x10E6/uL (ref 4.14–5.80)
RDW: 12.4 % (ref 12.3–15.4)
WBC: 5.6 10*3/uL (ref 3.4–10.8)

## 2017-10-24 LAB — LIPID PANEL
Chol/HDL Ratio: 6 ratio — ABNORMAL HIGH (ref 0.0–5.0)
Cholesterol, Total: 211 mg/dL — ABNORMAL HIGH (ref 100–199)
HDL: 35 mg/dL — AB (ref >39)
LDL Calculated: 143 mg/dL — ABNORMAL HIGH (ref 0–99)
Triglycerides: 164 mg/dL — ABNORMAL HIGH (ref 0–149)
VLDL Cholesterol Cal: 33 mg/dL (ref 5–40)

## 2017-10-24 LAB — HEMOGLOBIN A1C
Est. average glucose Bld gHb Est-mCnc: 111 mg/dL
HEMOGLOBIN A1C: 5.5 % (ref 4.8–5.6)

## 2017-10-24 LAB — TSH: TSH: 1.93 u[IU]/mL (ref 0.450–4.500)

## 2017-10-24 LAB — HEPATITIS C ANTIBODY: Hep C Virus Ab: <0.1 s/co ratio (ref 0.0–0.9)

## 2019-02-18 ENCOUNTER — Ambulatory Visit: Payer: Medicare PPO | Attending: Internal Medicine

## 2019-02-18 DIAGNOSIS — Z23 Encounter for immunization: Secondary | ICD-10-CM | POA: Insufficient documentation

## 2019-02-18 NOTE — Progress Notes (Signed)
   Covid-19 Vaccination Clinic  Name:  Martin Norris    MRN: [id number] DOB: [birth year]  02/18/2019  [name] was observed post Covid-19 immunization for 15 minutes without incidence. He was provided with Vaccine Information Sheet and instruction to access the V-Safe system.   [name] was instructed to call 911 with any severe reactions post vaccine: Marland Kitchen Difficulty breathing  . Swelling of your face and throat  . A fast heartbeat  . A bad rash all over your body  . Dizziness and weakness    Immunizations Administered    Name Date Dose VIS Date Route   Pfizer COVID-19 Vaccine 02/18/2019  9:47 AM 0.3 mL 01/08/2019 Intramuscular   Manufacturer: [employer], [employer]   Lot: [id number]   NDC: [address]-1

## 2019-03-11 ENCOUNTER — Ambulatory Visit: Payer: Medicare PPO | Attending: Internal Medicine

## 2019-03-11 DIAGNOSIS — Z23 Encounter for immunization: Secondary | ICD-10-CM | POA: Insufficient documentation

## 2019-03-11 NOTE — Progress Notes (Signed)
   Covid-19 Vaccination Clinic  Name:  Martin Norris    MRN: [id number] DOB: 20-Mar-1950  03/11/2019  [name] was observed post Covid-19 immunization for 15 minutes without incidence. He was provided with Vaccine Information Sheet and instruction to access the V-Safe system.   [name] was instructed to call 911 with any severe reactions post vaccine: Marland Kitchen Difficulty breathing  . Swelling of your face and throat  . A fast heartbeat  . A bad rash all over your body  . Dizziness and weakness    Immunizations Administered    Name Date Dose VIS Date Route   Pfizer COVID-19 Vaccine 03/11/2019  9:02 AM 0.3 mL 01/08/2019 Intramuscular   Manufacturer: [employer], [employer]   Lot: [id number]   NDC: [address]-0

## 2019-03-19 ENCOUNTER — Ambulatory Visit: Payer: BC Managed Care – PPO

## 2019-05-12 DIAGNOSIS — M25561 Pain in right knee: Secondary | ICD-10-CM | POA: Insufficient documentation

## 2019-09-06 ENCOUNTER — Telehealth: Payer: Self-pay | Admitting: Physician Assistant

## 2019-09-06 NOTE — Telephone Encounter (Signed)
Pt needs to be seen in office prior to a referral.

## 2019-09-06 NOTE — Telephone Encounter (Signed)
Patient was seen by Dr. Dione Booze eye specialist last week and the provider was worried about what he thinks was a TIA the patient has had recently that went untreated. He was advised to f/u with PCP. Dr. Laruth Bouchard office is supposed to fax [place] that OV note. I have scheduled an appt for the patient for Wed but was unsure if we just wanted to move forward with a referral to specialist without OV needed. Please advise best course of action.

## 2019-09-08 ENCOUNTER — Other Ambulatory Visit: Payer: Self-pay

## 2019-09-08 ENCOUNTER — Encounter: Payer: Self-pay | Admitting: Physician Assistant

## 2019-09-08 ENCOUNTER — Ambulatory Visit: Payer: Medicare PPO | Admitting: Physician Assistant

## 2019-09-08 VITALS — BP 135/79 | HR 78 | Temp 98.6°F | Ht 65.5 in | Wt 242.5 lb

## 2019-09-08 DIAGNOSIS — Z823 Family history of stroke: Secondary | ICD-10-CM

## 2019-09-08 DIAGNOSIS — R42 Dizziness and giddiness: Secondary | ICD-10-CM | POA: Diagnosis not present

## 2019-09-08 DIAGNOSIS — E782 Mixed hyperlipidemia: Secondary | ICD-10-CM

## 2019-09-08 DIAGNOSIS — Z820 Family history of epilepsy and other diseases of the nervous system: Secondary | ICD-10-CM

## 2019-09-08 DIAGNOSIS — G43909 Migraine, unspecified, not intractable, without status migrainosus: Secondary | ICD-10-CM

## 2019-09-08 DIAGNOSIS — Z8249 Family history of ischemic heart disease and other diseases of the circulatory system: Secondary | ICD-10-CM

## 2019-09-08 DIAGNOSIS — Z789 Other specified health status: Secondary | ICD-10-CM

## 2019-09-08 DIAGNOSIS — J329 Chronic sinusitis, unspecified: Secondary | ICD-10-CM

## 2019-09-08 DIAGNOSIS — H6593 Unspecified nonsuppurative otitis media, bilateral: Secondary | ICD-10-CM

## 2019-09-08 DIAGNOSIS — H5500 Unspecified nystagmus: Secondary | ICD-10-CM

## 2019-09-08 NOTE — Patient Instructions (Signed)
Dizziness Dizziness is a common problem. It is a feeling of unsteadiness or light-headedness. You may feel like you are about to faint. Dizziness can lead to injury if you stumble or fall. Anyone can become dizzy, but dizziness is more common in older adults. This condition can be caused by a number of things, including medicines, dehydration, or illness. Follow these instructions at home: Eating and drinking  Drink enough fluid to keep your urine clear or pale yellow. This helps to keep you from becoming dehydrated. Try to drink more clear fluids, such as water.  Do not drink alcohol.  Limit your caffeine intake if told to do so by your health care provider. Check ingredients and nutrition facts to see if a food or beverage contains caffeine.  Limit your salt (sodium) intake if told to do so by your health care provider. Check ingredients and nutrition facts to see if a food or beverage contains sodium. Activity  Avoid making quick movements. ? Rise slowly from chairs and steady yourself until you feel okay. ? In the morning, first sit up on the side of the bed. When you feel okay, stand slowly while you hold onto something until you know that your balance is fine.  If you need to stand in one place for a long time, move your legs often. Tighten and relax the muscles in your legs while you are standing.  Do not drive or use heavy machinery if you feel dizzy.  Avoid bending down if you feel dizzy. Place items in your home so that they are easy for you to reach without leaning over. Lifestyle  Do not use any products that contain nicotine or tobacco, such as cigarettes and e-cigarettes. If you need help quitting, ask your health care provider.  Try to reduce your stress level by using methods such as yoga or meditation. Talk with your health care provider if you need help to manage your stress. General instructions  Watch your dizziness for any changes.  Take over-the-counter and  prescription medicines only as told by your health care provider. Talk with your health care provider if you think that your dizziness is caused by a medicine that you are taking.  Tell a friend or a family member that you are feeling dizzy. If he or she notices any changes in your behavior, have this person call your health care provider.  Keep all follow-up visits as told by your health care provider. This is important. Contact a health care provider if:  Your dizziness does not go away.  Your dizziness or light-headedness gets worse.  You feel nauseous.  You have reduced hearing.  You have new symptoms.  You are unsteady on your feet or you feel like the room is spinning. Get help right away if:  You vomit or have diarrhea and are unable to eat or drink anything.  You have problems talking, walking, swallowing, or using your arms, hands, or legs.  You feel generally weak.  You are not thinking clearly or you have trouble forming sentences. It may take a friend or family member to notice this.  You have chest pain, abdominal pain, shortness of breath, or sweating.  Your vision changes.  You have any bleeding.  You have a severe headache.  You have neck pain or a stiff neck.  You have a fever. These symptoms may represent a serious problem that is an emergency. Do not wait to see if the symptoms will go away. Get medical help   right away. Call your local emergency services (911 in the [place] Do not drive yourself to the hospital. Summary  Dizziness is a feeling of unsteadiness or light-headedness. This condition can be caused by a number of things, including medicines, dehydration, or illness.  Anyone can become dizzy, but dizziness is more common in older adults.  Drink enough fluid to keep your urine clear or pale yellow. Do not drink alcohol.  Avoid making quick movements if you feel dizzy. Monitor your dizziness for any changes. This information is not intended to  replace advice given to you by your health care provider. Make sure you discuss any questions you have with your health care provider. Document Revised: 01/17/2017 Document Reviewed: 02/17/2016 Elsevier Patient Education  2020 Elsevier Inc.  

## 2019-09-08 NOTE — Progress Notes (Signed)
Established Patient Office Visit  Subjective:  Patient ID: Martin Norris, male    DOB: 01-Apr-1950  Age: 69 y.o. MRN: [id number]  CC:  Chief Complaint  Patient presents with  . possible recent TIA    HPI Martin Norris presents for concerns of possible TIA.  Patient reports about a week ago he experienced an episode of dizziness that felt like vertigo.  States he felt like his surroundings were spinning he he experienced brief vision loss.  States this past Saturday he had floaters and saw bright lights in both his eyes.  He saw his ophthalmologist who said there was some vitreal separation of his right eye which were causing the floaters, but he was concerned a possible TIA had occurred. States his father had several small strokes before he had a major one. 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  Also reports chronic sinus problems which he feels contributes to his vision symptoms.  He has tried taking Claritin but that gives him a dry mouth. Patient denies slurred speech, motor weakness, chest pain, or palpitations.  Patient reports it has been several years since he has had a stress test and he stopped taking statin medication due to side effects of muscle weakness and memory loss.  Past Medical History:  Diagnosis Date  . Chronic kidney disease    KIDNEY STONES  . Hyperlipidemia     Past Surgical History:  Procedure Laterality Date  . KNEE ARTHROSCOPY Left     Family History  Problem Relation Age of Onset  . Healthy Mother   . Heart disease Father   . Stroke Father   . Hyperlipidemia Father   . COPD Sister   . Hypertension Brother   . Hyperlipidemia Brother   . Hypertension Sister   . Hyperlipidemia Brother     Social History   Socioeconomic History  . Marital status: Married    Spouse name: Not on file  . Number of children: Not on file  . Years of education: Not on  file  . Highest education level: Not on file  Occupational History  . Not on file  Tobacco Use  . Smoking status: Former Smoker    Packs/day: 2.00    Years: 30.00    Pack years: 60.00    Types: Cigarettes    Quit date: 01/28/1989    Years since quitting: 30.6  . Smokeless tobacco: Never Used  Vaping Use  . Vaping Use: Never used  Substance and Sexual Activity  . Alcohol use: Yes    Comment: RARELY  . Drug use: Yes    Types: Marijuana    Comment: CASUALLY  . Sexual activity: Yes    Birth control/protection: None  Other Topics Concern  . Not on file  Social History Narrative  . Not on file   Social Determinants of Health   Financial Resource Strain:   . Difficulty of Paying Living Expenses:   Food Insecurity:   . Worried About Charity fundraiser in the Last Year:   . Arboriculturist in the Last Year:   Transportation Needs:   . Film/video editor (Medical):   Marland Kitchen Lack of Transportation (Non-Medical):   Physical Activity:   . Days of Exercise per Week:   . Minutes of Exercise per Session:   Stress:   . Feeling of Stress :   Social Connections:   .  Frequency of Communication with Friends and Family:   . Frequency of Social Gatherings with Friends and Family:   . Attends Religious Services:   . Active Member of Clubs or Organizations:   . Attends Archivist Meetings:   Marland Kitchen Marital Status:   Intimate Partner Violence:   . Fear of Current or Ex-Partner:   . Emotionally Abused:   Marland Kitchen Physically Abused:   . Sexually Abused:     Outpatient Medications Prior to Visit  Medication Sig Dispense Refill  . Ibuprofen (ADVIL PO) Take by mouth 3 (three) times daily as needed.    Marland Kitchen omega-3 acid ethyl esters (LOVAZA) 1 g capsule Take 2 capsules (2 g total) by mouth 2 (two) times daily. 180 capsule 2  . Red Yeast Rice Extract 600 MG CAPS Take 2 capsules by mouth daily.    . Turmeric 500 MG CAPS Take 2 capsules by mouth daily.     No facility-administered medications  prior to visit.    Allergies  Allergen Reactions  . Codeine     Anxious,intolerence,nervous,mainly cold medicine  . Statins Other (See Comments)    MYALGIAS    ROS Review of Systems  A fourteen system review of systems was performed and found to be positive as per HPI.  Objective:    Physical Exam General:  Well Developed, well nourished, appropriate for stated age.  Neuro:  Alert and oriented,  extra-ocular muscles intact, PERRLA, mild nystagmus noted with Dix-Hallpike, CNII-XII grossly intact  HEENT:  Normocephalic, atraumatic, bilateral middle ear effusion, neck supple Skin:  no gross rash, warm, pink. Cardiac:  RRR, S1 S2 Respiratory:  ECTA B/L and A/P, Not using accessory muscles, speaking in full sentences- unlabored. Vascular:  Ext warm, no cyanosis apprec.; cap RF less 2 sec. Psych:  No HI/SI, judgement and insight good, Euthymic mood. Full Affect.   BP 135/79   Pulse 78   Temp 98.6 F (37 C) (Oral)   Ht 5' 5.5" (1.664 m)   Wt 242 lb 8 oz (110 kg)   SpO2 96% Comment: on RA  BMI 39.74 kg/m  Wt Readings from Last 3 Encounters:  09/08/19 242 lb 8 oz (110 kg)  10/23/17 240 lb 14.4 oz (109.3 kg)  05/22/17 240 lb (108.9 kg)     Health Maintenance Due  Topic Date Due  . COLONOSCOPY  Never done  . PNA vac Low Risk Adult (1 of 2 - PCV13) Never done  . INFLUENZA VACCINE  08/29/2019    There are no preventive care reminders to display for this patient.  Lab Results  Component Value Date   TSH 1.930 10/23/2017   Lab Results  Component Value Date   WBC 5.6 10/23/2017   HGB 16.3 10/23/2017   HCT 48.2 10/23/2017   MCV 86 10/23/2017   PLT 241 10/23/2017   Lab Results  Component Value Date   NA 142 10/23/2017   K 4.3 10/23/2017   CO2 24 10/23/2017   GLUCOSE 99 10/23/2017   BUN 17 10/23/2017   CREATININE 1.01 10/23/2017   BILITOT 0.6 10/23/2017   ALKPHOS 63 10/23/2017   AST 25 10/23/2017   ALT 20 10/23/2017   PROT 6.3 10/23/2017   ALBUMIN 4.3  10/23/2017   CALCIUM 9.1 10/23/2017   Lab Results  Component Value Date   CHOL 211 (H) 10/23/2017   Lab Results  Component Value Date   HDL 35 (L) 10/23/2017   Lab Results  Component Value Date   LDLCALC 143 (  H) 10/23/2017   Lab Results  Component Value Date   TRIG 164 (H) 10/23/2017   Lab Results  Component Value Date   CHOLHDL 6.0 (H) 10/23/2017   Lab Results  Component Value Date   HGBA1C 5.5 10/23/2017      Assessment & Plan:   Problem List Items Addressed This Visit      Other   Hyperlipidemia   Relevant Orders   Comp Met (CMET)   Lipid Profile   Ambulatory referral to Cardiology    Other Visit Diagnoses    Dizziness    -  Primary   Relevant Orders   CBC w/Diff   Comp Met (CMET)   Lipid Profile   HgB A1c   B12   Sedimentation rate   C-reactive protein   Ambulatory referral to Cardiology   Ambulatory referral to Neurology   Family history of TIAs       Relevant Orders   Comp Met (CMET)   Lipid Profile   Ambulatory referral to Neurology   Migraine without status migrainosus, not intractable, unspecified migraine type       Relevant Orders   Sedimentation rate   C-reactive protein   Ambulatory referral to Neurology   Family history of heart disease       Relevant Orders   Lipid Profile   Ambulatory referral to Cardiology   Nystagmus       Relevant Orders   Ambulatory referral to Neurology   Chronic sinusitis, unspecified location       Middle ear effusion, bilateral       Statin intolerance       Relevant Orders   Ambulatory referral to Cardiology     Dizziness, Nystagmus, Family History of TIA's, Migraine: -Etiology is unclear, mild nystagmus was noted on exam so vertigo could partly be contributing to symptoms, however patient also reports a history of migraines with visual disturbances. Patient is agreeable to neurology referral for further evaluation. -Discussed red flag signs and symptoms to monitor for and to seek immediate medical  care/call 911.  Patient verbalized understanding. -Placed lab orders to evaluate for other possible etiologies.  Hyperlipidemia, family history of heart disease: -Last lipid panel elevated. Pt self-discontinued statin due to intolerance. -Rechecking lipid panel today. -Pt agreeable to cardiology referral for cardiac evaluation and assessing for CVD risk factors.  Chronic sinusitis, B/L MEE: -Recommend to switch antihistamine and try Allegra. -Recommend nasal rinses and Flonase. -Follow up in 2-3 months to reassess symptoms and further discuss management options.  No orders of the defined types were placed in this encounter.   Follow-up: Return for Chronic sinusitis, HLD in 2-3 months.    Lorrene Reid, PA-C

## 2019-09-09 LAB — CBC WITH DIFFERENTIAL/PLATELET
Basophils Absolute: 0.1 10*3/uL (ref 0.0–0.2)
Basos: 1 %
EOS (ABSOLUTE): 0.2 10*3/uL (ref 0.0–0.4)
Eos: 3 %
Hematocrit: 48.4 % (ref 37.5–51.0)
Hemoglobin: 16.5 g/dL (ref 13.0–17.7)
Immature Grans (Abs): 0 10*3/uL (ref 0.0–0.1)
Immature Granulocytes: 0 %
Lymphocytes Absolute: 1.4 10*3/uL (ref 0.7–3.1)
Lymphs: 21 %
MCH: 31.4 pg (ref 26.6–33.0)
MCHC: 34.1 g/dL (ref 31.5–35.7)
MCV: 92 fL (ref 79–97)
Monocytes Absolute: 0.6 10*3/uL (ref 0.1–0.9)
Monocytes: 9 %
Neutrophils Absolute: 4.3 10*3/uL (ref 1.4–7.0)
Neutrophils: 66 %
Platelets: 281 10*3/uL (ref 150–450)
RBC: 5.25 x10E6/uL (ref 4.14–5.80)
RDW: 12.5 % (ref 11.6–15.4)
WBC: 6.5 10*3/uL (ref 3.4–10.8)

## 2019-09-09 LAB — COMPREHENSIVE METABOLIC PANEL
ALT: 26 IU/L (ref 0–44)
AST: 30 IU/L (ref 0–40)
Albumin/Globulin Ratio: 1.9 (ref 1.2–2.2)
Albumin: 4.6 g/dL (ref 3.8–4.8)
Alkaline Phosphatase: 68 IU/L (ref 48–121)
BUN/Creatinine Ratio: 14 (ref 10–24)
BUN: 15 mg/dL (ref 8–27)
Bilirubin Total: 0.6 mg/dL (ref 0.0–1.2)
CO2: 28 mmol/L (ref 20–29)
Calcium: 9.8 mg/dL (ref 8.6–10.2)
Chloride: 99 mmol/L (ref 96–106)
Creatinine, Ser: 1.08 mg/dL (ref 0.76–1.27)
GFR calc Af Amer: 82 mL/min/{1.73_m2} (ref >59)
GFR calc non Af Amer: 71 mL/min/{1.73_m2} (ref >59)
Globulin, Total: 2.4 g/dL (ref 1.5–4.5)
Glucose: 105 mg/dL — ABNORMAL HIGH (ref 65–99)
Potassium: 4.6 mmol/L (ref 3.5–5.2)
Sodium: 140 mmol/L (ref 134–144)
Total Protein: 7 g/dL (ref 6.0–8.5)

## 2019-09-09 LAB — C-REACTIVE PROTEIN: CRP: 2 mg/L (ref 0–10)

## 2019-09-09 LAB — SEDIMENTATION RATE: Sed Rate: 3 mm/hr (ref 0–30)

## 2019-09-09 LAB — LIPID PANEL
Chol/HDL Ratio: 7 ratio — ABNORMAL HIGH (ref 0.0–5.0)
Cholesterol, Total: 230 mg/dL — ABNORMAL HIGH (ref 100–199)
HDL: 33 mg/dL — ABNORMAL LOW (ref >39)
LDL Chol Calc (NIH): 147 mg/dL — ABNORMAL HIGH (ref 0–99)
Triglycerides: 269 mg/dL — ABNORMAL HIGH (ref 0–149)
VLDL Cholesterol Cal: 50 mg/dL — ABNORMAL HIGH (ref 5–40)

## 2019-09-09 LAB — HEMOGLOBIN A1C
Est. average glucose Bld gHb Est-mCnc: 111 mg/dL
Hgb A1c MFr Bld: 5.5 % (ref 4.8–5.6)

## 2019-09-09 LAB — VITAMIN B12: Vitamin B-12: 543 pg/mL (ref 232–1245)

## 2019-09-14 ENCOUNTER — Encounter: Payer: Self-pay | Admitting: Diagnostic Neuroimaging

## 2019-09-14 ENCOUNTER — Ambulatory Visit: Payer: Medicare PPO | Admitting: Diagnostic Neuroimaging

## 2019-09-14 VITALS — BP 142/81 | HR 64 | Ht 67.0 in | Wt 244.6 lb

## 2019-09-14 DIAGNOSIS — H5461 Unqualified visual loss, right eye, normal vision left eye: Secondary | ICD-10-CM

## 2019-09-14 DIAGNOSIS — R42 Dizziness and giddiness: Secondary | ICD-10-CM | POA: Diagnosis not present

## 2019-09-14 MED ORDER — ASPIRIN EC 81 MG PO TBEC: 81.0000 mg | DELAYED_RELEASE_TABLET | Freq: Every day | ORAL | Status: AC

## 2019-09-14 NOTE — Patient Instructions (Signed)
TRANSIENT VISION LOSS (right eye) + TRANSIENT VERTIGO - check MRI, MRA head / neck - start aspirin 81mg  daily - consider zetia or PCSK9 inhibitors for lipid control - monitor BP  - improve nutrition and exercise - recommend echocardiogram / TTE (has cardiology appt pending; will let them order test)

## 2019-09-14 NOTE — Progress Notes (Signed)
[facility]  PATIENT: Martin Norris DOB: Jul 03, 1950  REFERRING CLINICIAN: Mayer Masker, PA-C HISTORY FROM: patient  REASON FOR VISIT: new consult    HISTORICAL  CHIEF COMPLAINT:  Chief Complaint  Patient presents with  . Migraine    rm 6 New Pt "rule out TIA/stroke due to right eye vision loss; also have star burst migraines/Advil knocks it out"  . Dizziness    HISTORY OF PRESENT ILLNESS:   69 year old male here for evaluation of vertigo, vision loss and headaches.    Patient has history of migraine with aura starting 10 to 15 years ago.  Patient would have Starburst visual disturbance and scotoma, followed by headache lasting for several hours.  10 days ago patient had onset of vertigo, while washing dishes, lasting 15 seconds.  Then he lost vision in his right eye briefly similar to his prior migraines.  This was followed by dull headache.  A week later he had right eye flashes, dark spots, dark floater sensation, followed up with eye doctor and was diagnosed with partial detachment of vitreous sac from retina.  Patient has family history of stroke in his father who also had significant carotid disease.  No problems with arms or legs, speech or swallowing.   REVIEW OF SYSTEMS: Full 14 system review of systems performed and negative with exception of: As per HPI.  ALLERGIES: Allergies  Allergen Reactions  . Codeine     Anxious,intolerence,nervous,mainly cold medicine  . Statins Other (See Comments)    MYALGIAS    HOME MEDICATIONS: Outpatient Medications Prior to Visit  Medication Sig Dispense Refill  . Ibuprofen (ADVIL PO) Take by mouth 3 (three) times daily as needed.     No facility-administered medications prior to visit.    PAST MEDICAL HISTORY: Past Medical History:  Diagnosis Date  . Chronic kidney disease    KIDNEY STONES  . Dizziness   . Hyperlipidemia   . Migraines   . Vertigo     PAST SURGICAL HISTORY: Past Surgical  History:  Procedure Laterality Date  . KNEE ARTHROSCOPY Left     FAMILY HISTORY: Family History  Problem Relation Age of Onset  . Healthy Mother   . Macular degeneration Mother   . Heart disease Father   . Stroke Father   . Hyperlipidemia Father   . COPD Sister   . Hypertension Brother   . Hyperlipidemia Brother   . Hypertension Sister   . Hyperlipidemia Brother   . Parkinson's disease Paternal Grandfather   . Stroke Paternal Grandfather   . Heart failure Paternal Grandfather     SOCIAL HISTORY: Social History   Socioeconomic History  . Marital status: Married    Spouse name: [name]  . Number of children: Not on file  . Years of education: Not on file  . Highest education level: 10th grade  Occupational History    Comment: retired AC/heat maintenance  Tobacco Use  . Smoking status: Former Smoker    Packs/day: 2.00    Years: 30.00    Pack years: 60.00    Types: Cigarettes    Quit date: 01/28/1989    Years since quitting: 30.6  . Smokeless tobacco: Never Used  Vaping Use  . Vaping Use: Never used  Substance and Sexual Activity  . Alcohol use: Yes    Alcohol/week: 2.0 standard drinks    Types: 2 Cans of beer per week    Comment: weekends  . Drug use: Yes    Types: Marijuana  Comment: CASUALLY, 1 x yr  . Sexual activity: Yes    Birth control/protection: None  Other Topics Concern  . Not on file  Social History Narrative   Lives with wife   Caffeine- coffee 1 c, Coke 1 daily, tea 2-3 daily   Social Determinants of Health   Financial Resource Strain:   . Difficulty of Paying Living Expenses:   Food Insecurity:   . Worried About Programme researcher, broadcasting/film/video in the Last Year:   . Barista in the Last Year:   Transportation Needs:   . Freight forwarder (Medical):   Marland Kitchen Lack of Transportation (Non-Medical):   Physical Activity:   . Days of Exercise per Week:   . Minutes of Exercise per Session:   Stress:   . Feeling of Stress :   Social Connections:    . Frequency of Communication with Friends and Family:   . Frequency of Social Gatherings with Friends and Family:   . Attends Religious Services:   . Active Member of Clubs or Organizations:   . Attends Banker Meetings:   Marland Kitchen Marital Status:   Intimate Partner Violence:   . Fear of Current or Ex-Partner:   . Emotionally Abused:   Marland Kitchen Physically Abused:   . Sexually Abused:      PHYSICAL EXAM  GENERAL EXAM/CONSTITUTIONAL: Vitals:  Vitals:   09/14/19 0840  BP: (!) 142/81  Pulse: 64  Weight: 244 lb 9.6 oz (110.9 kg)  Height: 5\' 7"  (1.702 m)     Body mass index is 38.31 kg/m. Wt Readings from Last 3 Encounters:  09/14/19 244 lb 9.6 oz (110.9 kg)  09/08/19 242 lb 8 oz (110 kg)  10/23/17 240 lb 14.4 oz (109.3 kg)     Patient is in no distress; well developed, nourished and groomed; neck is supple  CARDIOVASCULAR:  Examination of carotid arteries is normal; no carotid bruits  Regular rate and rhythm, no murmurs  Examination of peripheral vascular system by observation and palpation is normal  EYES:  Ophthalmoscopic exam of optic discs and posterior segments is normal; no papilledema or hemorrhages  No exam data present  MUSCULOSKELETAL:  Gait, strength, tone, movements noted in Neurologic exam below  NEUROLOGIC: MENTAL STATUS:  No flowsheet data found.  awake, alert, oriented to person, place and time  recent and remote memory intact  normal attention and concentration  language fluent, comprehension intact, naming intact  fund of knowledge appropriate  CRANIAL NERVE:   2nd - no papilledema on fundoscopic exam  2nd, 3rd, 4th, 6th - pupils equal and reactive to light, visual fields full to confrontation, extraocular muscles intact, no nystagmus  5th - facial sensation symmetric  7th - facial strength symmetric  8th - hearing intact  9th - palate elevates symmetrically, uvula midline  11th - shoulder shrug symmetric  12th -  tongue protrusion midline  MOTOR:   normal bulk and tone, full strength in the BUE, BLE  SENSORY:   normal and symmetric to light touch, temperature, vibration  COORDINATION:   finger-nose-finger, fine finger movements normal  REFLEXES:   deep tendon reflexes present and symmetric  GAIT/STATION:   narrow based gait     DIAGNOSTIC DATA (LABS, IMAGING, TESTING) - I reviewed patient records, labs, notes, testing and imaging myself where available.  Lab Results  Component Value Date   WBC 6.5 09/08/2019   HGB 16.5 09/08/2019   HCT 48.4 09/08/2019   MCV 92 09/08/2019   PLT  281 09/08/2019      Component Value Date/Time   NA 140 09/08/2019 1037   K 4.6 09/08/2019 1037   CL 99 09/08/2019 1037   CO2 28 09/08/2019 1037   GLUCOSE 105 (H) 09/08/2019 1037   BUN 15 09/08/2019 1037   CREATININE 1.08 09/08/2019 1037   CALCIUM 9.8 09/08/2019 1037   PROT 7.0 09/08/2019 1037   ALBUMIN 4.6 09/08/2019 1037   AST 30 09/08/2019 1037   ALT 26 09/08/2019 1037   ALKPHOS 68 09/08/2019 1037   BILITOT 0.6 09/08/2019 1037   GFRNONAA 71 09/08/2019 1037   GFRAA 82 09/08/2019 1037   Lab Results  Component Value Date   CHOL 230 (H) 09/08/2019   HDL 33 (L) 09/08/2019   LDLCALC 147 (H) 09/08/2019   TRIG 269 (H) 09/08/2019   CHOLHDL 7.0 (H) 09/08/2019   Lab Results  Component Value Date   HGBA1C 5.5 09/08/2019   Lab Results  Component Value Date   VITAMINB12 543 09/08/2019   Lab Results  Component Value Date   TSH 1.930 10/23/2017       ASSESSMENT AND PLAN  69 y.o. year old male here with history of migraine with aura, with new onset transient vision loss and transient vertigo in August 2021.  We will proceed with TIA/stroke work-up.  Dx:  1. Vertigo   2. Vision loss, right eye       PLAN:  TRANSIENT VISION LOSS (right eye) + TRANSIENT VERTIGO - check MRI, MRA head / neck - start aspirin 81mg  daily - consider zetia or PCSK9 inhibitors (intolerant of statins  in past) - monitor BP control - recommend TTE (has cardiology appt pending; will let them order test)  RIGHT EYE VITREOUS DETACHMENT - follow up with Dr.  Orders Placed This Encounter  Procedures  . MR BRAIN W WO CONTRAST  . MR ANGIO HEAD WO CONTRAST  . MR ANGIO NECK W WO CONTRAST   Meds ordered this encounter  Medications  . aspirin EC 81 MG tablet    Sig: Take 1 tablet (81 mg total) by mouth daily. Swallow whole.   Return for pending if symptoms worsen or fail to improve.    Dione Booze, MD 09/14/2019, 9:25 AM Certified in Neurology, Neurophysiology and Neuroimaging  [facility] [address] [place], [place] [id number]

## 2019-09-15 ENCOUNTER — Telehealth: Payer: Self-pay | Admitting: Diagnostic Neuroimaging

## 2019-09-15 NOTE — Telephone Encounter (Signed)
[employer] pending  

## 2019-09-15 NOTE — Telephone Encounter (Signed)
Martin Norris: [id number] (exp. 09/15/19 to 10/15/19) order sent to GI. They will reach out to the patient to schedule.

## 2019-09-19 ENCOUNTER — Ambulatory Visit
Admission: RE | Admit: 2019-09-19 | Discharge: 2019-09-19 | Disposition: A | Discharge status: Home / Self Care | Payer: Medicare PPO | Source: Ambulatory Visit | Attending: Diagnostic Neuroimaging | Admitting: Diagnostic Neuroimaging

## 2019-09-19 DIAGNOSIS — H5461 Unqualified visual loss, right eye, normal vision left eye: Secondary | ICD-10-CM

## 2019-09-19 DIAGNOSIS — R42 Dizziness and giddiness: Secondary | ICD-10-CM

## 2019-09-19 MED ORDER — GADOBENATE DIMEGLUMINE 529 MG/ML IV SOLN
20.0000 mL | Freq: Once | INTRAVENOUS | Status: AC | PRN
  Administered 2019-09-19: 20 mL via INTRAVENOUS

## 2019-09-22 ENCOUNTER — Telehealth: Payer: Self-pay | Admitting: *Deleted

## 2019-09-22 NOTE — Telephone Encounter (Signed)
Called patient him his MRI brain, MRA head/neck are all normal scans. He has started taking ASA 81 mg, has FU with Dr Dione Booze and cardiology. Patient verbalized understanding, appreciation.

## 2019-09-22 NOTE — Telephone Encounter (Signed)
Pt returned RN's call for his results. Please call back when available.

## 2019-10-28 ENCOUNTER — Other Ambulatory Visit: Payer: Self-pay

## 2019-10-28 ENCOUNTER — Encounter: Payer: Self-pay | Admitting: Cardiology

## 2019-10-28 ENCOUNTER — Ambulatory Visit (INDEPENDENT_AMBULATORY_CARE_PROVIDER_SITE_OTHER): Payer: Medicare PPO | Admitting: Cardiology

## 2019-10-28 VITALS — BP 140/84 | HR 65 | Ht 67.0 in | Wt 240.0 lb

## 2019-10-28 DIAGNOSIS — Z01812 Encounter for preprocedural laboratory examination: Secondary | ICD-10-CM | POA: Diagnosis not present

## 2019-10-28 DIAGNOSIS — Z789 Other specified health status: Secondary | ICD-10-CM

## 2019-10-28 DIAGNOSIS — R072 Precordial pain: Secondary | ICD-10-CM | POA: Diagnosis not present

## 2019-10-28 MED ORDER — METOPROLOL TARTRATE 100 MG PO TABS: 100.0000 mg | ORAL_TABLET | Freq: Once | ORAL | 0 refills | Status: DC

## 2019-10-28 NOTE — Progress Notes (Signed)
Cardiology Office Note:    Date:  10/28/2019   ID:  Martin Norris, DOB 02-Mar-1950, MRN [id number]  PCP:  Lorrene Reid, PA-C  [facility] Cardiologist:  Candee Furbish, MD  [facility] Electrophysiologist:  None   Referring MD: Lorrene Reid, PA-C     History of Present Illness:    Martin Norris is a 69 y.o. male here for the evaluation of hyperlipidemia, statin intolerance, chest discomfort with history of hiatal hernia at the request of Lorrene Reid, PA  Recent LDL 147 triglycerides 269 total cholesterol 230.  Has not been able to take statins.  He has had significant myalgias with different medications.  Hiatal hernia bothers him, occasionally he will have substernal chest discomfort that is quite severe that he has been attributing to this hiatal hernia.  Drinking decaf now.   Eye issues. Saw neuro. All testing OK. Dr. Katy Fitch with eye. Right eye.   Joal Eakle son works in [employer], patient With 4 stent recently. Father had bypass, stroke.  Lived for 12 years after that.  Used to smoke for years. 29 years ago quit.    Past Medical History:  Diagnosis Date   Chronic kidney disease    KIDNEY STONES   Dizziness    Hyperlipidemia    Migraines    Vertigo     Past Surgical History:  Procedure Laterality Date   KNEE ARTHROSCOPY Left     Current Medications: Current Meds  Medication Sig   aspirin EC 81 MG tablet Take 1 tablet (81 mg total) by mouth daily. Swallow whole.   Ibuprofen (ADVIL PO) Take by mouth 3 (three) times daily as needed.     Allergies:   Codeine and Statins   Social History   Socioeconomic History   Marital status: Married    Spouse name: Manuela Schwartz   Number of children: Not on file   Years of education: Not on file   Highest education level: 10th grade  Occupational History    Comment: retired Field seismologist maintenance  Tobacco Use   Smoking status: Former Smoker    Packs/day: 2.00    Years: 30.00    Pack years: 60.00      Types: Cigarettes    Quit date: 01/28/1989    Years since quitting: 30.7   Smokeless tobacco: Never Used  Vaping Use   Vaping Use: Never used  Substance and Sexual Activity   Alcohol use: Yes    Alcohol/week: 2.0 standard drinks    Types: 2 Cans of beer per week    Comment: weekends   Drug use: Yes    Types: Marijuana    Comment: CASUALLY, 1 x yr   Sexual activity: Yes    Birth control/protection: None  Other Topics Concern   Not on file  Social History Narrative   Lives with wife   Caffeine- coffee 1 c, Coke 1 daily, tea 2-3 daily   Social Determinants of Health   Financial Resource Strain:    Difficulty of Paying Living Expenses: Not on file  Food Insecurity:    Worried About Charity fundraiser in the Last Year: Not on file   [employer] of Food in the Last Year: Not on file  Transportation Needs:    Lack of Transportation (Medical): Not on file   Lack of Transportation (Non-Medical): Not on file  Physical Activity:    Days of Exercise per Week: Not on file   Minutes of Exercise per Session: Not on file  Stress:    Feeling of Stress : Not on file  Social Connections:    Frequency of Communication with Friends and Family: Not on file   Frequency of Social Gatherings with Friends and Family: Not on file   Attends Religious Services: Not on file   Active Member of Clubs or Organizations: Not on file   Attends Archivist Meetings: Not on file   Marital Status: Not on file     Family History: The patient's family history includes COPD in his sister; Healthy in his mother; Heart disease in his father; Heart failure in his paternal grandfather; Hyperlipidemia in his brother, brother, and father; Hypertension in his brother and sister; Macular degeneration in his mother; Parkinson's disease in his paternal grandfather; Stroke in his father and paternal grandfather.  ROS:   Please see the history of present illness.   No bleeding fevers chills  nausea vomiting syncope  All other systems reviewed and are negative.  EKGs/Labs/Other Studies Reviewed:    The following studies were reviewed today:  MRI/MRA 09/19/2019 of brain-normal.  EKG:  EKG is  ordered today.  The ekg ordered today demonstrates sinus rhythm 65 with PAC.  Recent Labs: 09/08/2019: ALT 26; BUN 15; Creatinine, Ser 1.08; Hemoglobin 16.5; Platelets 281; Potassium 4.6; Sodium 140  Recent Lipid Panel    Component Value Date/Time   CHOL 230 (H) 09/08/2019 1037   TRIG 269 (H) 09/08/2019 1037   HDL 33 (L) 09/08/2019 1037   CHOLHDL 7.0 (H) 09/08/2019 1037   LDLCALC 147 (H) 09/08/2019 1037    Physical Exam:    VS:  BP 140/84    Pulse 65    Ht 5' 7"  (1.702 m)    Wt 240 lb (108.9 kg)    SpO2 97%    BMI 37.59 kg/m     Wt Readings from Last 3 Encounters:  10/28/19 240 lb (108.9 kg)  09/14/19 244 lb 9.6 oz (110.9 kg)  09/08/19 242 lb 8 oz (110 kg)     GEN:  Well nourished, well developed in no acute distress HEENT: Normal NECK: No JVD; No carotid bruits LYMPHATICS: No lymphadenopathy CARDIAC: RRR, no murmurs, rubs, gallops RESPIRATORY:  Clear to auscultation without rales, wheezing or rhonchi  ABDOMEN: Soft, non-tender, non-distended MUSCULOSKELETAL:  No edema; No deformity  SKIN: Warm and dry NEUROLOGIC:  Alert and oriented x 3 PSYCHIATRIC:  Normal affect   ASSESSMENT:    1. Pre-procedure lab exam   2. Precordial pain   3. Statin intolerance    PLAN:    In order of problems listed above:  Chest discomfort -Does admit that he has a hiatal hernia however his chest comfort is quite significant and severe at times.  With his father's history, prior smoking history, hyperlipidemia, like to go ahead check a coronary CT scan with possible FFR analysis.  Statin intolerance/hyperlipidemia -Has tried statins in the past.  States that he is 1 of those people that cannot take 1.  In 2 months, I would like to get him in with the lipid clinic after we have the  results of the coronary CT scan.  Certainly if he has demonstrable coronary atherosclerosis, he may qualify for agents such as PCSK9 inhibitors. -Continue with low-dose aspirin. -Last LDL 147, triglycerides 269 ALT 26 hemoglobin 16.5, CRP 2 from outside labs.    Medication Adjustments/Labs and Tests Ordered: Current medicines are reviewed at length with the patient today.  Concerns regarding medicines are outlined above.  Orders Placed This  Encounter  Procedures   CT CORONARY MORPH W/CTA COR W/SCORE W/CA W/CM &/OR WO/CM   CT CORONARY FRACTIONAL FLOW RESERVE DATA PREP   CT CORONARY FRACTIONAL FLOW RESERVE FLUID ANALYSIS   Basic metabolic panel   AMB Referral to [facility]   EKG 12-Lead   Meds ordered this encounter  Medications   metoprolol tartrate (LOPRESSOR) 100 MG tablet    Sig: Take 1 tablet (100 mg total) by mouth once for 1 dose. Take 1 tablet 2 hours before your CT scan    Dispense:  1 tablet    Refill:  0    Patient Instructions  Medication Instructions:  The current medical regimen is effective;  continue present plan and medications.  *If you need a refill on your cardiac medications before your next appointment, please call your pharmacy*  Lab Work: You will need lab work before your CT scan (BMP) If you have labs (blood work) drawn today and your tests are completely normal, you will receive your results only by:  [place] (if you have MyChart) OR  A paper copy in the mail If you have any lab test that is abnormal or we need to change your treatment, we will call you to review the results.  Testing/Procedures: You have been referred to the [facility].  Dr Marlou Porch would like you to be seen after your Coronary CT has been completed.  Your cardiac CT will be scheduled at:   [facility] [address] [place], [place] [address] [phone]  Please arrive at the [facility] main entrance of [facility]  30 minutes prior to test start time. Proceed to the [facility] Radiology Department (first floor) to check-in and test prep.  Please follow these instructions carefully (unless otherwise directed):  Hold all erectile dysfunction medications at least 3 days (72 hrs) prior to test.  On the Night Before the Test:  Be sure to Drink plenty of water.  Do not consume any caffeinated/decaffeinated beverages or chocolate 12 hours prior to your test.  Do not take any antihistamines 12 hours prior to your test.  On the Day of the Test:  Drink plenty of water. Do not drink any water within one hour of the test.  Do not eat any food 4 hours prior to the test.  You may take your regular medications prior to the test.   Take metoprolol (Lopressor) two hours prior to test.  HOLD Furosemide/Hydrochlorothiazide morning of the test.  After the Test:  Drink plenty of water.  After receiving IV contrast, you may experience a mild flushed feeling. This is normal.  On occasion, you may experience a mild rash up to 24 hours after the test. This is not dangerous. If this occurs, you can take Benadryl 25 mg and increase your fluid intake.  If you experience trouble breathing, this can be serious. If it is severe call 911 IMMEDIATELY. If it is mild, please call our office.  Once we have confirmed authorization from your insurance company, we will call you to set up a date and time for your test. Based on how quickly your insurance processes prior authorizations requests, please allow up to 4 weeks to be contacted for scheduling your Cardiac CT appointment. Be advised that routine Cardiac CT appointments could be scheduled as many as 8 weeks after your provider has ordered it.  For non-scheduling related questions, please contact the cardiac imaging nurse navigator should you have any questions/concerns: Clarise Cruz  Juleen China, Cardiac Imaging Nurse Navigator Burley Saver, Interim Cardiac Imaging Nurse  Navigator [facility] Direct Office Dial: [phone]   For scheduling needs, including cancellations and rescheduling, please call Vivien Rota at [phone], option [phone].   Follow-Up: At [facility], you and your health needs are our priority.  As part of our continuing mission to provide you with exceptional heart care, we have created designated Provider Care Teams.  These Care Teams include your primary Cardiologist (physician) and Advanced Practice Providers (APPs -  Physician Assistants and Nurse Practitioners) who all work together to provide you with the care you need, when you need it.  We recommend signing up for the patient portal called "MyChart".  Sign up information is provided on this After Visit Summary.  MyChart is used to connect with patients for Virtual Visits (Telemedicine).  Patients are able to view lab/test results, encounter notes, upcoming appointments, etc.  Non-urgent messages can be sent to your provider as well.   To learn more about what you can do with MyChart, go to [email or web address].    Your next appointment:   6 month(s)  The format for your next appointment:   In Person  Provider:   Candee Furbish, MD   Thank you for choosing [facility]!!         Signed, Candee Furbish, MD  10/28/2019 11:45 AM    [place]

## 2019-10-28 NOTE — Patient Instructions (Signed)
Medication Instructions:  The current medical regimen is effective;  continue present plan and medications.  *If you need a refill on your cardiac medications before your next appointment, please call your pharmacy*  Lab Work: You will need lab work before your CT scan (BMP) If you have labs (blood work) drawn today and your tests are completely normal, you will receive your results only by: Marland Kitchen MyChart Message (if you have MyChart) OR . A paper copy in the mail If you have any lab test that is abnormal or we need to change your treatment, we will call you to review the results.  Testing/Procedures: You have been referred to the [facility].  Dr Marlou Porch would like you to be seen after your Coronary CT has been completed.  Your cardiac CT will be scheduled at:   [facility] [address] [place], [place] [address] [phone]  Please arrive at the [facility] main entrance of [facility] 30 minutes prior to test start time. Proceed to the [facility] Radiology Department (first floor) to check-in and test prep.  Please follow these instructions carefully (unless otherwise directed):  Hold all erectile dysfunction medications at least 3 days (72 hrs) prior to test.  On the Night Before the Test: . Be sure to Drink plenty of water. . Do not consume any caffeinated/decaffeinated beverages or chocolate 12 hours prior to your test. . Do not take any antihistamines 12 hours prior to your test.  On the Day of the Test: . Drink plenty of water. Do not drink any water within one hour of the test. . Do not eat any food 4 hours prior to the test. . You may take your regular medications prior to the test.  . Take metoprolol (Lopressor) two hours prior to test. . HOLD Furosemide/Hydrochlorothiazide morning of the test.  After the Test: . Drink plenty of water. . After receiving IV contrast, you may experience a mild flushed feeling. This is normal. . On occasion,  you may experience a mild rash up to 24 hours after the test. This is not dangerous. If this occurs, you can take Benadryl 25 mg and increase your fluid intake. . If you experience trouble breathing, this can be serious. If it is severe call 911 IMMEDIATELY. If it is mild, please call our office.  Once we have confirmed authorization from your insurance company, we will call you to set up a date and time for your test. Based on how quickly your insurance processes prior authorizations requests, please allow up to 4 weeks to be contacted for scheduling your Cardiac CT appointment. Be advised that routine Cardiac CT appointments could be scheduled as many as 8 weeks after your provider has ordered it.  For non-scheduling related questions, please contact the cardiac imaging nurse navigator should you have any questions/concerns: Marchia Bond, Cardiac Imaging Nurse Navigator Burley Saver, Interim Cardiac Imaging Nurse [place] [facility] Direct Office Dial: [phone]   For scheduling needs, including cancellations and rescheduling, please call Vivien Rota at [phone], option 3.   Follow-Up: At [facility], you and your health needs are our priority.  As part of our continuing mission to provide you with exceptional heart care, we have created designated Provider Care Teams.  These Care Teams include your primary Cardiologist (physician) and Advanced Practice Providers (APPs -  Physician Assistants and Nurse Practitioners) who all work together to provide you with the care you need, when you need it.  We recommend signing up for the patient portal called "MyChart".  Sign up information is provided on this After Visit Summary.  MyChart is used to connect with patients for Virtual Visits (Telemedicine).  Patients are able to view lab/test results, encounter notes, upcoming appointments, etc.  Non-urgent messages can be sent to your provider as well.   To learn more about what  you can do with MyChart, go to [email or web address].    Your next appointment:   6 month(s)  The format for your next appointment:   In Person  Provider:   Candee Furbish, MD   Thank you for choosing [facility]!!

## 2019-11-05 ENCOUNTER — Telehealth: Payer: Self-pay

## 2019-11-05 NOTE — Telephone Encounter (Signed)
-----   Message from Marylynn Pearson sent at 11/02/2019 12:45 PM EDT ----- Regarding: ct heart   Patient scheduled 10/14 @ 1pm.  He will need labs prior  Thanks, Sheralyn Boatman

## 2019-11-05 NOTE — Telephone Encounter (Signed)
Called patient to schedule BMET before his CT on 10/14. Left message for patient to call back.

## 2019-11-05 NOTE — Telephone Encounter (Signed)
Patient called back. Made him an appointment on 10/12 for a BMET.

## 2019-11-09 ENCOUNTER — Other Ambulatory Visit: Payer: Medicare PPO | Admitting: *Deleted

## 2019-11-09 ENCOUNTER — Other Ambulatory Visit: Payer: Self-pay

## 2019-11-09 DIAGNOSIS — R072 Precordial pain: Secondary | ICD-10-CM

## 2019-11-09 DIAGNOSIS — Z01812 Encounter for preprocedural laboratory examination: Secondary | ICD-10-CM

## 2019-11-09 LAB — BASIC METABOLIC PANEL
BUN/Creatinine Ratio: 15 (ref 10–24)
BUN: 16 mg/dL (ref 8–27)
CO2: 26 mmol/L (ref 20–29)
Calcium: 9.2 mg/dL (ref 8.6–10.2)
Chloride: 100 mmol/L (ref 96–106)
Creatinine, Ser: 1.06 mg/dL (ref 0.76–1.27)
GFR calc Af Amer: 84 mL/min/{1.73_m2} (ref >59)
GFR calc non Af Amer: 73 mL/min/{1.73_m2} (ref >59)
Glucose: 71 mg/dL (ref 65–99)
Potassium: 4.3 mmol/L (ref 3.5–5.2)
Sodium: 138 mmol/L (ref 134–144)

## 2019-11-10 ENCOUNTER — Telehealth (HOSPITAL_COMMUNITY): Payer: Self-pay | Admitting: Emergency Medicine

## 2019-11-10 NOTE — Telephone Encounter (Signed)
Pt returning phone call regarding upcoming cardiac imaging study; pt verbalizes understanding of appt date/time, parking situation and where to check in, pre-test NPO status and medications ordered, and verified current allergies; name and call back number provided for further questions should they arise Rockwell Alexandria RN Navigator Cardiac Imaging [facility] [phone] office [phone] cell  Pt verbalized understanding not to use ED medications prior to scan. Also verbalized understanding to take PO metoprolol 2 hr prior to scan. Huntley Dec

## 2019-11-10 NOTE — Telephone Encounter (Signed)
Attempted to call patient regarding upcoming cardiac CT appointment. °Left message on voicemail with name and callback number °  RN Navigator Cardiac Imaging °[facility] °[phone] Office °[phone] Cell ° °

## 2019-11-11 ENCOUNTER — Other Ambulatory Visit: Payer: Self-pay

## 2019-11-11 ENCOUNTER — Ambulatory Visit: Payer: Medicare PPO | Admitting: Physician Assistant

## 2019-11-11 ENCOUNTER — Ambulatory Visit (HOSPITAL_COMMUNITY)
Admission: RE | Admit: 2019-11-11 | Discharge: 2019-11-11 | Disposition: A | Discharge status: Home / Self Care | Payer: Medicare PPO | Source: Ambulatory Visit | Attending: Cardiology | Admitting: Cardiology

## 2019-11-11 DIAGNOSIS — I2510 Atherosclerotic heart disease of native coronary artery without angina pectoris: Secondary | ICD-10-CM | POA: Diagnosis not present

## 2019-11-11 DIAGNOSIS — R072 Precordial pain: Secondary | ICD-10-CM

## 2019-11-11 MED ORDER — NITROGLYCERIN 0.4 MG SL SUBL
0.8000 mg | SUBLINGUAL_TABLET | Freq: Once | SUBLINGUAL | Status: AC
  Administered 2019-11-11: 0.8 mg via SUBLINGUAL

## 2019-11-11 MED ORDER — NITROGLYCERIN 0.4 MG SL SUBL
SUBLINGUAL_TABLET | SUBLINGUAL | Status: AC
  Filled 2019-11-11: qty 2

## 2019-11-11 MED ORDER — IOHEXOL 350 MG/ML SOLN
80.0000 mL | Freq: Once | INTRAVENOUS | Status: AC | PRN
  Administered 2019-11-11: 80 mL via INTRAVENOUS

## 2019-11-12 DIAGNOSIS — I2510 Atherosclerotic heart disease of native coronary artery without angina pectoris: Secondary | ICD-10-CM | POA: Diagnosis not present

## 2019-11-15 ENCOUNTER — Telehealth: Payer: Self-pay

## 2019-11-15 NOTE — Telephone Encounter (Signed)
The patient has been notified of the result and verbalized understanding.  All questions (if any) were answered.  10/20 appt made with Dr. Ashley Mariner, RN 11/15/2019 8:41 AM

## 2019-11-15 NOTE — Telephone Encounter (Signed)
-----   Message from Jake Bathe, MD sent at 11/13/2019 10:14 AM EDT ----- CT and FFR results reviewed. Will have him come back to clinic to discuss catheterization. Wednesday 10:40 appears open.   Thanks   Donato Schultz MD

## 2019-11-17 ENCOUNTER — Ambulatory Visit: Payer: Medicare PPO | Admitting: Cardiology

## 2019-11-17 ENCOUNTER — Other Ambulatory Visit: Payer: Self-pay

## 2019-11-17 ENCOUNTER — Encounter: Payer: Self-pay | Admitting: *Deleted

## 2019-11-17 ENCOUNTER — Encounter: Payer: Self-pay | Admitting: Cardiology

## 2019-11-17 VITALS — BP 138/84 | HR 64 | Ht 67.0 in | Wt 234.0 lb

## 2019-11-17 DIAGNOSIS — I2510 Atherosclerotic heart disease of native coronary artery without angina pectoris: Secondary | ICD-10-CM | POA: Diagnosis not present

## 2019-11-17 DIAGNOSIS — I209 Angina pectoris, unspecified: Secondary | ICD-10-CM | POA: Diagnosis not present

## 2019-11-17 DIAGNOSIS — Z789 Other specified health status: Secondary | ICD-10-CM | POA: Diagnosis not present

## 2019-11-17 DIAGNOSIS — E782 Mixed hyperlipidemia: Secondary | ICD-10-CM | POA: Diagnosis not present

## 2019-11-17 NOTE — Progress Notes (Signed)
Cardiology Office Note:    Date:  11/17/2019   ID:  Martin Norris, DOB 11/18/1950, MRN [id number]  PCP:  Mayer Masker, PA-C  [facility] Cardiologist:  Donato Schultz, MD  [facility] Electrophysiologist:  None   Referring MD: Mayer Masker, PA-C     History of Present Illness:    Martin Norris is a 69 y.o. male with coronary artery disease, abnormal CT scan of coronary arteries with abnormal FFR here for follow-up.  He has been experiencing occasional substernal chest discomfort that has been quite severe that he is been attributing to his hiatal hernia.  Martin Norris, his son works in [employer].  His son has had 4 stents recently.  Family history includes a father with bypass and stroke.  His wife had heart catheterization ended up with bleed at groin site as she was preparing to go home 2 days after heart catheterization.  He is also had an LDL of 147 but has not been able to take statins.  Significant myalgias.  He quit smoking over 29 years ago.  I showed him the images from the [facility] analysis and heart flow. -Mid LAD flow-limiting disease at diagonal 2 -Mid occluded circumflex -RCA appears patent.    Past Medical History:  Diagnosis Date  . Chronic kidney disease    KIDNEY STONES  . Dizziness   . Hyperlipidemia   . Migraines   . Vertigo     Past Surgical History:  Procedure Laterality Date  . KNEE ARTHROSCOPY Left     Current Medications: Current Meds  Medication Sig  . aspirin EC 81 MG tablet Take 1 tablet (81 mg total) by mouth daily. Swallow whole.  . Ibuprofen (ADVIL PO) Take by mouth 3 (three) times daily as needed.  . metoprolol tartrate (LOPRESSOR) 100 MG tablet Take 1 tablet (100 mg total) by mouth once for 1 dose. Take 1 tablet 2 hours before your CT scan     Allergies:   Codeine and Statins   Social History   Socioeconomic History  . Marital status: Married    Spouse name: Martin Norris  . Number of children: Not on file  . Years of  education: Not on file  . Highest education level: 10th grade  Occupational History    Comment: retired AC/heat maintenance  Tobacco Use  . Smoking status: Former Smoker    Packs/day: 2.00    Years: 30.00    Pack years: 60.00    Types: Cigarettes    Quit date: 01/28/1989    Years since quitting: 30.8  . Smokeless tobacco: Never Used  Vaping Use  . Vaping Use: Never used  Substance and Sexual Activity  . Alcohol use: Yes    Alcohol/week: 2.0 standard drinks    Types: 2 Cans of beer per week    Comment: weekends  . 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 Sexual activity: Yes    Birth control/protection: None  Other Topics Concern  . Not on file  Social History Narrative   Lives with wife   Caffeine- coffee 1 c, Coke 1 daily, tea 2-3 daily   Social Determinants of Health   Financial Resource Strain:   . Difficulty of Paying Living Expenses: Not on file  Food Insecurity:   . Worried About Programme researcher, broadcasting/film/video in the Last Year: Not on file  . Ran Out of Food in the Last Year: Not on file  Transportation Needs:   . Freight forwarder (Medical): Not on file  . Lack of Transportation (Non-Medical): Not on file  Physical Activity:   . Days of Exercise per Week: Not on file  . Minutes of Exercise per Session: Not on file  Stress:   . Feeling of Stress : Not on file  Social Connections:   . Frequency of Communication with Friends and Family: Not on file  . Frequency of Social Gatherings with Friends and Family: Not on file  . Attends Religious Services: Not on file  . Active Member of Clubs or Organizations: Not on file  . Attends Banker Meetings: Not on file  . Marital Status: Not on file     Family History: The patient's family history includes COPD in his sister; Healthy in his mother; Heart disease in his father; Heart failure in his paternal grandfather; Hyperlipidemia in his brother, brother, and father; Hypertension in his  brother and sister; Macular degeneration in his mother; Parkinson's disease in his paternal grandfather; Stroke in his father and paternal grandfather.  ROS:   Please see the history of present illness.     All other systems reviewed and are negative.  EKGs/Labs/Other Studies Reviewed:    The following studies were reviewed today: CT scan and FFR analysis reviewed.  Multivessel CAD.  EKG:  EKG is  ordered today.  The ekg ordered today demonstrates sinus rhythm 64 with PVC  Recent Labs: 09/08/2019: ALT 26; Hemoglobin 16.5; Platelets 281 11/09/2019: BUN 16; Creatinine, Ser 1.06; Potassium 4.3; Sodium 138  Recent Lipid Panel    Component Value Date/Time   CHOL 230 (H) 09/08/2019 1037   TRIG 269 (H) 09/08/2019 1037   HDL 33 (L) 09/08/2019 1037   CHOLHDL 7.0 (H) 09/08/2019 1037   LDLCALC 147 (H) 09/08/2019 1037     Risk Assessment/Calculations:       Physical Exam:    VS:  BP 138/84   Pulse 64   Ht 5\' 7"  (1.702 m)   Wt 234 lb (106.1 kg)   SpO2 98%   BMI 36.65 kg/m     Wt Readings from Last 3 Encounters:  11/17/19 234 lb (106.1 kg)  10/28/19 240 lb (108.9 kg)  09/14/19 244 lb 9.6 oz (110.9 kg)     GEN:  Well nourished, well developed in no acute distress HEENT: Normal NECK: No JVD; No carotid bruits LYMPHATICS: No lymphadenopathy CARDIAC: RRR, no murmurs, rubs, gallops RESPIRATORY:  Clear to auscultation without rales, wheezing or rhonchi  ABDOMEN: Soft, non-tender, non-distended MUSCULOSKELETAL:  No edema; No deformity  SKIN: Warm and dry NEUROLOGIC:  Alert and oriented x 3 PSYCHIATRIC:  Normal affect   ASSESSMENT:    1. Coronary artery disease involving native coronary artery of native heart without angina pectoris   2. Angina pectoris (HCC)   3. Statin intolerance   4. Mixed hyperlipidemia    PLAN:    In order of problems listed above:  Abnormal CT scan/coronary artery disease -CT scan results demonstrate triple-vessel disease:  1. Left Main: No  functionally significant stenosis  2. LAD: Functionally significant stenosis in the mid LAD, FFR 0.56 with FFR 0.55 in the D2 vessel  3. LCX: Modeled as a total occlusion of the mid LCx  4. RCA: Distal, functional stenosis in the PDA.  FFR 0.76  IMPRESSION: Evidence of multi-vessel, functionally significant coronary artery disease.  Based upon these findings, recommend cardiac catheterization.  Risks and benefits of been explained including stroke heart attack  death renal impairment bleeding.  He is willing to proceed.  Statin intolerance/hyperlipidemia -We have placed a referral in for the lipid clinic.  Given his underlying coronary artery disease and inability to take statins, I think he would qualify for PCSK9 inhibitors. -Last LDL 147 triglycerides 269 ALT 26 hemoglobin 16.5 and CRP 2.   Medication Adjustments/Labs and Tests Ordered: Current medicines are reviewed at length with the patient today.  Concerns regarding medicines are outlined above.  Orders Placed This Encounter  Procedures  . Basic metabolic panel  . CBC  . EKG 12-Lead   No orders of the defined types were placed in this encounter.   Patient Instructions  Medication Instructions:  No changes *If you need a refill on your cardiac medications before your next appointment, please call your pharmacy*   Lab Work: Today: CBC, BMET If you have labs (blood work) drawn today and your tests are completely normal, you will receive your results only by: Marland Kitchen MyChart Message (if you have MyChart) OR . A paper copy in the mail If you have any lab test that is abnormal or we need to change your treatment, we will call you to review the results.   Testing/Procedures: Your physician has requested that you have a cardiac catheterization. Cardiac catheterization is used to diagnose and/or treat various heart conditions. Doctors may recommend this procedure for a number of different reasons. The most common reason is to  evaluate chest pain. Chest pain can be a symptom of coronary artery disease (CAD), and cardiac catheterization can show whether plaque is narrowing or blocking your heart's arteries. This procedure is also used to evaluate the valves, as well as measure the blood flow and oxygen levels in different parts of your heart. For further information please visit [email or web address] Please follow instruction sheet, as given.   Follow-Up: At [facility], you and your health needs are our priority.  As part of our continuing mission to provide you with exceptional heart care, we have created designated Provider Care Teams.  These Care Teams include your primary Cardiologist (physician) and Advanced Practice Providers (APPs -  Physician Assistants and Nurse Practitioners) who all work together to provide you with the care you need, when you need it.   Your next appointment:   4-6 weeks  The format for your next appointment:   In Person  Provider:   You may see Donato Schultz, MD or one of the following Advanced Practice Providers on your designated Care Team:    Norma Fredrickson, NP  Nada Boozer, NP  Georgie Chard, NP    Other Instructions      Signed, Donato Schultz, MD  11/17/2019 1:55 PM    [facility]

## 2019-11-17 NOTE — H&P (View-Only) (Signed)
Cardiology Office Note:    Date:  11/17/2019   ID:  Martin Norris, DOB 11/18/1950, MRN [id number]  PCP:  Mayer Masker, PA-C  [facility] Cardiologist:  Donato Schultz, MD  [facility] Electrophysiologist:  None   Referring MD: Mayer Masker, PA-C     History of Present Illness:    Martin Norris is a 69 y.o. male with coronary artery disease, abnormal CT scan of coronary arteries with abnormal FFR here for follow-up.  He has been experiencing occasional substernal chest discomfort that has been quite severe that he is been attributing to his hiatal hernia.  [name], his son works in [employer].  His son has had 4 stents recently.  Family history includes a father with bypass and stroke.  His wife had heart catheterization ended up with bleed at groin site as she was preparing to go home 2 days after heart catheterization.  He is also had an LDL of 147 but has not been able to take statins.  Significant myalgias.  He quit smoking over 29 years ago.  I showed him the images from the [facility] analysis and heart flow. -Mid LAD flow-limiting disease at diagonal 2 -Mid occluded circumflex -RCA appears patent.    Past Medical History:  Diagnosis Date  . Chronic kidney disease    KIDNEY STONES  . Dizziness   . Hyperlipidemia   . Migraines   . Vertigo     Past Surgical History:  Procedure Laterality Date  . KNEE ARTHROSCOPY Left     Current Medications: Current Meds  Medication Sig  . aspirin EC 81 MG tablet Take 1 tablet (81 mg total) by mouth daily. Swallow whole.  . Ibuprofen (ADVIL PO) Take by mouth 3 (three) times daily as needed.  . metoprolol tartrate (LOPRESSOR) 100 MG tablet Take 1 tablet (100 mg total) by mouth once for 1 dose. Take 1 tablet 2 hours before your CT scan     Allergies:   Codeine and Statins   Social History   Socioeconomic History  . Marital status: Married    Spouse name: [name]  . Number of children: Not on file  . Years of  education: Not on file  . Highest education level: 10th grade  Occupational History    Comment: retired AC/heat maintenance  Tobacco Use  . Smoking status: Former Smoker    Packs/day: 2.00    Years: 30.00    Pack years: 60.00    Types: Cigarettes    Quit date: 01/28/1989    Years since quitting: 30.8  . Smokeless tobacco: Never Used  Vaping Use  . Vaping Use: Never used  Substance and Sexual Activity  . Alcohol use: Yes    Alcohol/week: 2.0 standard drinks    Types: 2 Cans of beer per week    Comment: weekends  . 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 Sexual activity: Yes    Birth control/protection: None  Other Topics Concern  . Not on file  Social History Narrative   Lives with wife   Caffeine- coffee 1 c, Coke 1 daily, tea 2-3 daily   Social Determinants of Health   Financial Resource Strain:   . Difficulty of Paying Living Expenses: Not on file  Food Insecurity:   . Worried About Programme researcher, broadcasting/film/video in the Last Year: Not on file  . Ran Out of Food in the Last Year: Not on file  Transportation Needs:   . Freight forwarder (Medical): Not on file  . Lack of Transportation (Non-Medical): Not on file  Physical Activity:   . Days of Exercise per Week: Not on file  . Minutes of Exercise per Session: Not on file  Stress:   . Feeling of Stress : Not on file  Social Connections:   . Frequency of Communication with Friends and Family: Not on file  . Frequency of Social Gatherings with Friends and Family: Not on file  . Attends Religious Services: Not on file  . Active Member of Clubs or Organizations: Not on file  . Attends Banker Meetings: Not on file  . Marital Status: Not on file     Family History: The patient's family history includes COPD in his sister; Healthy in his mother; Heart disease in his father; Heart failure in his paternal grandfather; Hyperlipidemia in his brother, brother, and father; Hypertension in his  brother and sister; Macular degeneration in his mother; Parkinson's disease in his paternal grandfather; Stroke in his father and paternal grandfather.  ROS:   Please see the history of present illness.     All other systems reviewed and are negative.  EKGs/Labs/Other Studies Reviewed:    The following studies were reviewed today: CT scan and FFR analysis reviewed.  Multivessel CAD.  EKG:  EKG is  ordered today.  The ekg ordered today demonstrates sinus rhythm 64 with PVC  Recent Labs: 09/08/2019: ALT 26; Hemoglobin 16.5; Platelets 281 11/09/2019: BUN 16; Creatinine, Ser 1.06; Potassium 4.3; Sodium 138  Recent Lipid Panel    Component Value Date/Time   CHOL 230 (H) 09/08/2019 1037   TRIG 269 (H) 09/08/2019 1037   HDL 33 (L) 09/08/2019 1037   CHOLHDL 7.0 (H) 09/08/2019 1037   LDLCALC 147 (H) 09/08/2019 1037     Risk Assessment/Calculations:       Physical Exam:    VS:  BP 138/84   Pulse 64   Ht 5\' 7"  (1.702 m)   Wt 234 lb (106.1 kg)   SpO2 98%   BMI 36.65 kg/m     Wt Readings from Last 3 Encounters:  11/17/19 234 lb (106.1 kg)  10/28/19 240 lb (108.9 kg)  09/14/19 244 lb 9.6 oz (110.9 kg)     GEN:  Well nourished, well developed in no acute distress HEENT: Normal NECK: No JVD; No carotid bruits LYMPHATICS: No lymphadenopathy CARDIAC: RRR, no murmurs, rubs, gallops RESPIRATORY:  Clear to auscultation without rales, wheezing or rhonchi  ABDOMEN: Soft, non-tender, non-distended MUSCULOSKELETAL:  No edema; No deformity  SKIN: Warm and dry NEUROLOGIC:  Alert and oriented x 3 PSYCHIATRIC:  Normal affect   ASSESSMENT:    1. Coronary artery disease involving native coronary artery of native heart without angina pectoris   2. Angina pectoris (HCC)   3. Statin intolerance   4. Mixed hyperlipidemia    PLAN:    In order of problems listed above:  Abnormal CT scan/coronary artery disease -CT scan results demonstrate triple-vessel disease:  1. Left Main: No  functionally significant stenosis  2. LAD: Functionally significant stenosis in the mid LAD, FFR 0.56 with FFR 0.55 in the D2 vessel  3. LCX: Modeled as a total occlusion of the mid LCx  4. RCA: Distal, functional stenosis in the PDA.  FFR 0.76  IMPRESSION: Evidence of multi-vessel, functionally significant coronary artery disease.  Based upon these findings, recommend cardiac catheterization.  Risks and benefits of been explained including stroke heart attack  death renal impairment bleeding.  He is willing to proceed.  Statin intolerance/hyperlipidemia -We have placed a referral in for the lipid clinic.  Given his underlying coronary artery disease and inability to take statins, I think he would qualify for PCSK9 inhibitors. -Last LDL 147 triglycerides 269 ALT 26 hemoglobin 16.5 and CRP 2.   Medication Adjustments/Labs and Tests Ordered: Current medicines are reviewed at length with the patient today.  Concerns regarding medicines are outlined above.  Orders Placed This Encounter  Procedures  . Basic metabolic panel  . CBC  . EKG 12-Lead   No orders of the defined types were placed in this encounter.   Patient Instructions  Medication Instructions:  No changes *If you need a refill on your cardiac medications before your next appointment, please call your pharmacy*   Lab Work: Today: CBC, BMET If you have labs (blood work) drawn today and your tests are completely normal, you will receive your results only by: Marland Kitchen MyChart Message (if you have MyChart) OR . A paper copy in the mail If you have any lab test that is abnormal or we need to change your treatment, we will call you to review the results.   Testing/Procedures: Your physician has requested that you have a cardiac catheterization. Cardiac catheterization is used to diagnose and/or treat various heart conditions. Doctors may recommend this procedure for a number of different reasons. The most common reason is to  evaluate chest pain. Chest pain can be a symptom of coronary artery disease (CAD), and cardiac catheterization can show whether plaque is narrowing or blocking your heart's arteries. This procedure is also used to evaluate the valves, as well as measure the blood flow and oxygen levels in different parts of your heart. For further information please visit [email or web address] Please follow instruction sheet, as given.   Follow-Up: At [facility], you and your health needs are our priority.  As part of our continuing mission to provide you with exceptional heart care, we have created designated Provider Care Teams.  These Care Teams include your primary Cardiologist (physician) and Advanced Practice Providers (APPs -  Physician Assistants and Nurse Practitioners) who all work together to provide you with the care you need, when you need it.   Your next appointment:   4-6 weeks  The format for your next appointment:   In Person  Provider:   You may see Donato Schultz, MD or one of the following Advanced Practice Providers on your designated Care Team:    Norma Fredrickson, NP  Nada Boozer, NP  Georgie Chard, NP    Other Instructions      Signed, Donato Schultz, MD  11/17/2019 1:55 PM    [facility]

## 2019-11-17 NOTE — Patient Instructions (Signed)
Medication Instructions:  No changes *If you need a refill on your cardiac medications before your next appointment, please call your pharmacy*   Lab Work: Today: CBC, BMET If you have labs (blood work) drawn today and your tests are completely normal, you will receive your results only by: Marland Kitchen MyChart Message (if you have MyChart) OR . A paper copy in the mail If you have any lab test that is abnormal or we need to change your treatment, we will call you to review the results.   Testing/Procedures: Your physician has requested that you have a cardiac catheterization. Cardiac catheterization is used to diagnose and/or treat various heart conditions. Doctors may recommend this procedure for a number of different reasons. The most common reason is to evaluate chest pain. Chest pain can be a symptom of coronary artery disease (CAD), and cardiac catheterization can show whether plaque is narrowing or blocking your heart's arteries. This procedure is also used to evaluate the valves, as well as measure the blood flow and oxygen levels in different parts of your heart. For further information please visit [email or web address] Please follow instruction sheet, as given.   Follow-Up: At [facility], you and your health needs are our priority.  As part of our continuing mission to provide you with exceptional heart care, we have created designated Provider Care Teams.  These Care Teams include your primary Cardiologist (physician) and Advanced Practice Providers (APPs -  Physician Assistants and Nurse Practitioners) who all work together to provide you with the care you need, when you need it.   Your next appointment:   4-6 weeks  The format for your next appointment:   In Person  Provider:   You may see Donato Schultz, MD or one of the following Advanced Practice Providers on your designated Care Team:    Norma Fredrickson, NP  Nada Boozer, NP  Georgie Chard, NP    Other Instructions

## 2019-11-18 LAB — BASIC METABOLIC PANEL
BUN/Creatinine Ratio: 15 (ref 10–24)
BUN: 14 mg/dL (ref 8–27)
CO2: 25 mmol/L (ref 20–29)
Calcium: 9.8 mg/dL (ref 8.6–10.2)
Chloride: 98 mmol/L (ref 96–106)
Creatinine, Ser: 0.94 mg/dL (ref 0.76–1.27)
GFR calc Af Amer: 97 mL/min/{1.73_m2} (ref >59)
GFR calc non Af Amer: 84 mL/min/{1.73_m2} (ref >59)
Glucose: 96 mg/dL (ref 65–99)
Potassium: 4.4 mmol/L (ref 3.5–5.2)
Sodium: 138 mmol/L (ref 134–144)

## 2019-11-18 LAB — CBC
Hematocrit: 49.4 % (ref 37.5–51.0)
Hemoglobin: 17 g/dL (ref 13.0–17.7)
MCH: 30.4 pg (ref 26.6–33.0)
MCHC: 34.4 g/dL (ref 31.5–35.7)
MCV: 88 fL (ref 79–97)
Platelets: 276 10*3/uL (ref 150–450)
RBC: 5.59 x10E6/uL (ref 4.14–5.80)
RDW: 12.5 % (ref 11.6–15.4)
WBC: 9.1 10*3/uL (ref 3.4–10.8)

## 2019-11-20 ENCOUNTER — Other Ambulatory Visit (HOSPITAL_COMMUNITY)
Admission: RE | Admit: 2019-11-20 | Discharge: 2019-11-20 | Disposition: A | Discharge status: Home / Self Care | Payer: Medicare PPO | Source: Ambulatory Visit | Attending: Interventional Cardiology | Admitting: Interventional Cardiology

## 2019-11-20 DIAGNOSIS — Z20822 Contact with and (suspected) exposure to covid-19: Secondary | ICD-10-CM | POA: Insufficient documentation

## 2019-11-20 DIAGNOSIS — Z01812 Encounter for preprocedural laboratory examination: Secondary | ICD-10-CM | POA: Insufficient documentation

## 2019-11-21 LAB — SARS CORONAVIRUS 2 (TAT 6-24 HRS): SARS Coronavirus 2: NEGATIVE

## 2019-11-22 ENCOUNTER — Telehealth: Payer: Self-pay | Admitting: *Deleted

## 2019-11-22 NOTE — Telephone Encounter (Signed)
Pt contacted pre-catheterization scheduled at [facility] for: Tuesday November 23, 2019 10:30 AM Verified arrival time and place: [facility] Main Entrance [facility]) at: 8:30 AM   No solid food after midnight prior to cath, clear liquids until 5 AM day of procedure.   AM meds can be  taken pre-cath with sips of water including: ASA 81 mg   Confirmed patient has responsible adult to drive home post procedure and be with patient first 24 hours after arriving home: yes  You are allowed ONE visitor in the waiting room during the time you are at the hospital for your procedure. Both you and your visitor must wear a mask once you enter the hospital.       COVID-19 Pre-Screening Questions:   In the past 14 days have you had a new cough, new headache, new nasal congestion, fever (100.4 or greater) unexplained body aches, new sore throat, or sudden loss of taste or sense of smell? no  In the past 14 days have you been around anyone with known Covid 19? no  Have you been vaccinated for COVID-19? Yes, see immunization history  Reviewed procedure/mask/visitor instructions, COVID-19 questions with patient

## 2019-11-23 ENCOUNTER — Ambulatory Visit (HOSPITAL_COMMUNITY)
Admission: RE | Admit: 2019-11-23 | Discharge: 2019-11-23 | Disposition: A | Discharge status: Home / Self Care | Payer: Medicare PPO | Attending: Interventional Cardiology | Admitting: Interventional Cardiology

## 2019-11-23 ENCOUNTER — Other Ambulatory Visit: Payer: Self-pay

## 2019-11-23 ENCOUNTER — Encounter (HOSPITAL_COMMUNITY)
Admission: RE | Disposition: A | Discharge status: Home / Self Care | Payer: Self-pay | Source: Home / Self Care | Attending: Interventional Cardiology

## 2019-11-23 ENCOUNTER — Ambulatory Visit: Payer: Medicare PPO | Admitting: Physician Assistant

## 2019-11-23 DIAGNOSIS — I2510 Atherosclerotic heart disease of native coronary artery without angina pectoris: Secondary | ICD-10-CM

## 2019-11-23 DIAGNOSIS — Z87891 Personal history of nicotine dependence: Secondary | ICD-10-CM | POA: Diagnosis not present

## 2019-11-23 DIAGNOSIS — Z888 Allergy status to other drugs, medicaments and biological substances status: Secondary | ICD-10-CM | POA: Diagnosis not present

## 2019-11-23 DIAGNOSIS — E782 Mixed hyperlipidemia: Secondary | ICD-10-CM | POA: Diagnosis not present

## 2019-11-23 DIAGNOSIS — I25119 Atherosclerotic heart disease of native coronary artery with unspecified angina pectoris: Secondary | ICD-10-CM | POA: Diagnosis not present

## 2019-11-23 DIAGNOSIS — Z789 Other specified health status: Secondary | ICD-10-CM

## 2019-11-23 DIAGNOSIS — Z885 Allergy status to narcotic agent status: Secondary | ICD-10-CM | POA: Insufficient documentation

## 2019-11-23 DIAGNOSIS — I209 Angina pectoris, unspecified: Secondary | ICD-10-CM

## 2019-11-23 DIAGNOSIS — E785 Hyperlipidemia, unspecified: Secondary | ICD-10-CM | POA: Diagnosis present

## 2019-11-23 HISTORY — PX: LEFT HEART CATH AND CORONARY ANGIOGRAPHY: CATH118249

## 2019-11-23 SURGERY — LEFT HEART CATH AND CORONARY ANGIOGRAPHY
Anesthesia: LOCAL

## 2019-11-23 MED ORDER — ASPIRIN 81 MG PO CHEW: 81.0000 mg | CHEWABLE_TABLET | ORAL | Status: DC

## 2019-11-23 MED ORDER — ACETAMINOPHEN 325 MG PO TABS: 650.0000 mg | ORAL_TABLET | ORAL | Status: DC | PRN

## 2019-11-23 MED ORDER — SODIUM CHLORIDE 0.9 % IV SOLN: INTRAVENOUS | Status: DC

## 2019-11-23 MED ORDER — SODIUM CHLORIDE 0.9 % WEIGHT BASED INFUSION
3.0000 mL/kg/h | INTRAVENOUS | Status: DC
  Administered 2019-11-23: 3 mL/kg/h via INTRAVENOUS

## 2019-11-23 MED ORDER — LABETALOL HCL 5 MG/ML IV SOLN: 10.0000 mg | INTRAVENOUS | Status: DC | PRN

## 2019-11-23 MED ORDER — MIDAZOLAM HCL 2 MG/2ML IJ SOLN
INTRAMUSCULAR | Status: AC
  Filled 2019-11-23: qty 2

## 2019-11-23 MED ORDER — HEPARIN SODIUM (PORCINE) 1000 UNIT/ML IJ SOLN
INTRAMUSCULAR | Status: DC | PRN
  Administered 2019-11-23: 5000 [IU] via INTRAVENOUS

## 2019-11-23 MED ORDER — SODIUM CHLORIDE 0.9% FLUSH: 3.0000 mL | INTRAVENOUS | Status: DC | PRN

## 2019-11-23 MED ORDER — MIDAZOLAM HCL 2 MG/2ML IJ SOLN
INTRAMUSCULAR | Status: DC | PRN
  Administered 2019-11-23 (×2): 1 mg via INTRAVENOUS

## 2019-11-23 MED ORDER — IOHEXOL 350 MG/ML SOLN
INTRAVENOUS | Status: DC | PRN
  Administered 2019-11-23: 80 mL

## 2019-11-23 MED ORDER — VERAPAMIL HCL 2.5 MG/ML IV SOLN
INTRAVENOUS | Status: AC
  Filled 2019-11-23: qty 2

## 2019-11-23 MED ORDER — SODIUM CHLORIDE 0.9% FLUSH: 3.0000 mL | Freq: Two times a day (BID) | INTRAVENOUS | Status: DC

## 2019-11-23 MED ORDER — VERAPAMIL HCL 2.5 MG/ML IV SOLN
INTRAVENOUS | Status: DC | PRN
  Administered 2019-11-23: 10 mL via INTRA_ARTERIAL

## 2019-11-23 MED ORDER — SODIUM CHLORIDE 0.9 % WEIGHT BASED INFUSION: 1.0000 mL/kg/h | INTRAVENOUS | Status: DC

## 2019-11-23 MED ORDER — FENTANYL CITRATE (PF) 100 MCG/2ML IJ SOLN
INTRAMUSCULAR | Status: DC | PRN
Start: 2019-11-23 — End: 2019-11-23
  Administered 2019-11-23: 50 ug via INTRAVENOUS

## 2019-11-23 MED ORDER — SODIUM CHLORIDE 0.9 % IV SOLN: 250.0000 mL | INTRAVENOUS | Status: DC | PRN

## 2019-11-23 MED ORDER — HYDRALAZINE HCL 20 MG/ML IJ SOLN: 10.0000 mg | INTRAMUSCULAR | Status: DC | PRN

## 2019-11-23 MED ORDER — HEPARIN (PORCINE) IN NACL 1000-0.9 UT/500ML-% IV SOLN
INTRAVENOUS | Status: AC
  Filled 2019-11-23: qty 1000

## 2019-11-23 MED ORDER — SODIUM CHLORIDE 0.9% FLUSH
3.0000 mL | Freq: Two times a day (BID) | INTRAVENOUS | Status: DC
  Administered 2019-11-23: 3 mL via INTRAVENOUS

## 2019-11-23 MED ORDER — HEPARIN (PORCINE) IN NACL 1000-0.9 UT/500ML-% IV SOLN
INTRAVENOUS | Status: DC | PRN
  Administered 2019-11-23 (×2): 500 mL

## 2019-11-23 MED ORDER — LIDOCAINE HCL (PF) 1 % IJ SOLN
INTRAMUSCULAR | Status: AC
  Filled 2019-11-23: qty 30

## 2019-11-23 MED ORDER — HEPARIN SODIUM (PORCINE) 1000 UNIT/ML IJ SOLN
INTRAMUSCULAR | Status: AC
  Filled 2019-11-23: qty 1

## 2019-11-23 MED ORDER — ONDANSETRON HCL 4 MG/2ML IJ SOLN: 4.0000 mg | Freq: Four times a day (QID) | INTRAMUSCULAR | Status: DC | PRN

## 2019-11-23 MED ORDER — ASPIRIN 81 MG PO CHEW: 81.0000 mg | CHEWABLE_TABLET | Freq: Every day | ORAL | Status: DC

## 2019-11-23 MED ORDER — LIDOCAINE HCL (PF) 1 % IJ SOLN
INTRAMUSCULAR | Status: DC | PRN
  Administered 2019-11-23: 2 mL

## 2019-11-23 SURGICAL SUPPLY — 9 items
CATH 5FR JL3.5 JR4 ANG PIG MP (CATHETERS) ×2 IMPLANT
GLIDESHEATH SLEND A-KIT 6F 22G (SHEATH) ×1 IMPLANT
GUIDEWIRE INQWIRE 1.5J.035X260 (WIRE) IMPLANT
INQWIRE 1.5J .035X260CM (WIRE) ×2
KIT HEART LEFT (KITS) ×2 IMPLANT
PACK CARDIAC CATHETERIZATION (CUSTOM PROCEDURE TRAY) ×2 IMPLANT
SHEATH PROBE COVER 6X72 (BAG) ×1 IMPLANT
TRANSDUCER W/STOPCOCK (MISCELLANEOUS) ×2 IMPLANT
TUBING CIL FLEX 10 FLL-RA (TUBING) ×2 IMPLANT

## 2019-11-23 NOTE — CV Procedure (Signed)
·   Functionally occluded large second obtuse marginal which feels from the right coronary by collaterals around the apex.  50 to 60% proximal and 70-80 % mid LAD stenosis.  The mid stenosis forms a [place] 110 with the largest of 3 diagonal branches.  Moderate sized first diagonal contains segmental 80% stenosis.  Dominant right coronary with large branching PDA each limb of which contains 50% narrowing.  The PDA supplies well-formed collaterals to the obtuse marginal.  Overall normal/low normal LV function.  Normal LVEDP 7 mmHg.  RECOMMENDATIONS aggressive risk factor modification, anti-ischemic therapy, if/when symptoms develop consider surgical versus PCI.  LAD PCI with jailed multiple diagonal branches patient myocardium.  Management and diagonal grafting and stents.  Ultimately therapy planning if symptoms develop will depend upon clinical scenario.

## 2019-11-23 NOTE — Discharge Instructions (Signed)
Drink plenty of fluids for 48 hours and keep wrist elevated at heart level for 24 hours  Radial Site Care   This sheet gives you information about how to care for yourself after your procedure. Your health care provider may also give you more specific instructions. If you have problems or questions, contact your health care provider. What can I expect after the procedure? After the procedure, it is common to have:  Bruising and tenderness at the catheter insertion area. Follow these instructions at home: Medicines  Take over-the-counter and prescription medicines only as told by your health care provider. Insertion site care 1. Follow instructions from your health care provider about how to take care of your insertion site. Make sure you: ? Wash your hands with soap and water before you change your bandage (dressing). If soap and water are not available, use hand sanitizer. ? Remove your dressing as told by your health care provider. In 24 hours 2. Check your insertion site every day for signs of infection. Check for: ? Redness, swelling, or pain. ? Fluid or blood. ? Pus or a bad smell. ? Warmth. 3. Do not take baths, swim, or use a hot tub until your health care provider approves. 4. You may shower 24-48 hours after the procedure, or as directed by your health care provider. ? Remove the dressing and gently wash the site with plain soap and water. ? Pat the area dry with a clean towel. ? Do not rub the site. That could cause bleeding. 5. Do not apply powder or lotion to the site. Activity   1. For 24 hours after the procedure, or as directed by your health care provider: ? Do not flex or bend the affected arm. ? Do not push or pull heavy objects with the affected arm. ? Do not drive yourself home from the hospital or clinic. You may drive 24 hours after the procedure unless your health care provider tells you not to. ? Do not operate machinery or power tools. 2. Do not lift  anything that is heavier than 10 lb (4.5 kg), or the limit that you are told, until your health care provider says that it is safe.  For 4 days 3. Ask your health care provider when it is okay to: ? Return to work or school. ? Resume usual physical activities or sports. ? Resume sexual activity. General instructions  If the catheter site starts to bleed, raise your arm and put firm pressure on the site. If the bleeding does not stop, get help right away. This is a medical emergency.  If you went home on the same day as your procedure, a responsible adult should be with you for the first 24 hours after you arrive home.  Keep all follow-up visits as told by your health care provider. This is important. Contact a health care provider if:  You have a fever.  You have redness, swelling, or yellow drainage around your insertion site. Get help right away if:  You have unusual pain at the radial site.  The catheter insertion area swells very fast.  The insertion area is bleeding, and the bleeding does not stop when you hold steady pressure on the area.  Your arm or hand becomes pale, cool, tingly, or numb. These symptoms may represent a serious problem that is an emergency. Do not wait to see if the symptoms will go away. Get medical help right away. Call your local emergency services (911 in the [place] Do   not drive yourself to the hospital. Summary  After the procedure, it is common to have bruising and tenderness at the site.  Follow instructions from your health care provider about how to take care of your radial site wound. Check the wound every day for signs of infection.  Do not lift anything that is heavier than 10 lb (4.5 kg), or the limit that you are told, until your health care provider says that it is safe. This information is not intended to replace advice given to you by your health care provider. Make sure you discuss any questions you have with your health care  provider. Document Revised: 02/19/2017 Document Reviewed: 02/19/2017 Elsevier Patient Education  2020 Elsevier Inc.  

## 2019-11-23 NOTE — Interval H&P Note (Signed)
Cath Lab Visit (complete for each Cath Lab visit)  Clinical Evaluation Leading to the Procedure:   ACS: No.  Non-ACS:    Anginal Classification: CCS III  Anti-ischemic medical therapy: Minimal Therapy (1 class of medications)  Non-Invasive Test Results: High-risk stress test findings: cardiac mortality >3%/year  Prior CABG: No previous CABG      History and Physical Interval Note:  11/23/2019 9:01 AM  Martin Norris  has presented today for surgery, with the diagnosis of CAD.  The various methods of treatment have been discussed with the patient and family. After consideration of risks, benefits and other options for treatment, the patient has consented to  Procedure(s): LEFT HEART CATH AND CORONARY ANGIOGRAPHY (N/A) as a surgical intervention.  The patient's history has been reviewed, patient examined, no change in status, stable for surgery.  I have reviewed the patient's chart and labs.  Questions were answered to the patient's satisfaction.     Lyn Records III

## 2019-11-24 ENCOUNTER — Encounter (HOSPITAL_COMMUNITY): Payer: Self-pay | Admitting: Interventional Cardiology

## 2019-12-01 ENCOUNTER — Telehealth: Payer: Self-pay | Admitting: Cardiology

## 2019-12-01 NOTE — Telephone Encounter (Signed)
Encounter not needed

## 2019-12-02 ENCOUNTER — Telehealth: Payer: Self-pay | Admitting: Physician Assistant

## 2019-12-02 ENCOUNTER — Other Ambulatory Visit: Payer: Self-pay

## 2019-12-02 ENCOUNTER — Ambulatory Visit (INDEPENDENT_AMBULATORY_CARE_PROVIDER_SITE_OTHER): Payer: Medicare PPO | Admitting: Pharmacist

## 2019-12-02 DIAGNOSIS — E782 Mixed hyperlipidemia: Secondary | ICD-10-CM | POA: Diagnosis not present

## 2019-12-02 MED ORDER — ROSUVASTATIN CALCIUM 5 MG PO TABS: 5.0000 mg | ORAL_TABLET | Freq: Every day | ORAL | 3 refills | Status: DC

## 2019-12-02 NOTE — Patient Instructions (Signed)
It was nice to meet you!  Great work on [employer] changes. Keep up the good work.  Please start taking rosuvastatin 5mg  daily.  We will recheck your lipid panel on 12/16  Call 1/17 at [phone] with any questions or concerns.

## 2019-12-02 NOTE — Telephone Encounter (Signed)
Patient states he has a missed call from [place] but I am not sure from who and he did not know. He has an appointment coming up so I am not sure if it was a reminder for that. Thanks

## 2019-12-02 NOTE — Progress Notes (Addendum)
Patient ID: Martin Norris                 DOB: 1950/02/20                    MRN: [id number]     HPI: Martin Norris is a 69 y.o. male patient referred to lipid clinic by Dr. Anne Fu. PMH is significant for coronary artery disease, abnormal CT scan of coronary arteries with abnormal FFR.   Patient presents today in good spirits. Patient reported only trying atorvastatin 10 mg daily in the past. He took atorvastatin for about one month and felt terrible while taking it. He had pain all over. He was told there were no other options and has not tried any statin since. Patient is interested in CV risk reduction. He has already made dietary changes including less fried foods and eliminating potato chips and sweet tea.   Current Medications: No lipid lowering therapies. ASA 81 mg daily Intolerances: atorvastatin 10 mg daily (stopped due to myalgias in 2018)  Risk Factors: CAD with angina LDL goal: <70 mg/dL  Diet:  Breakfast: one egg fried on white toast or bowl of cereal  Lunch: [place] sandwich and pretzels  Dinner: chicken pan fried in olive oil and rice, chicken pot pie  Snacks: pretzels  Drinks: 1 cup decaf per day, 1 soda per week, 4 beers per week  Exercise: Walks a lot and works in the yard. Patient chops wood, rakes leaves, and plows.    Family History: Father with bypass and stroke. Heart failure in his paternal grandfather; Hyperlipidemia in his brother and father; Hypertension in his brother and sister; Stroke in his paternal grandfather.  Social History: Former smoker, quit in 1991. Previously smoked 2 packs/day for 30 years (60 pack years).   Labs: 09/08/19 TC 230 TG 269 LDL 147 HDL 33 -- not on medication  Past Medical History:  Diagnosis Date  . Chronic kidney disease    KIDNEY STONES  . Dizziness   . Hyperlipidemia   . Migraines   . Vertigo     Current Outpatient Medications on File Prior to Visit  Medication Sig Dispense Refill  . aspirin EC 81 MG tablet Take 1  tablet (81 mg total) by mouth daily. Swallow whole.    . famotidine (PEPCID) 10 MG tablet Take 10 mg by mouth daily as needed for heartburn or indigestion.    . Ibuprofen (ADVIL PO) Take 1 tablet by mouth 3 (three) times daily as needed (pain). Duo    . metoprolol tartrate (LOPRESSOR) 100 MG tablet Take 1 tablet (100 mg total) by mouth once for 1 dose. Take 1 tablet 2 hours before your CT scan (Patient not taking: Reported on 11/17/2019) 1 tablet 0   No current facility-administered medications on file prior to visit.    Allergies  Allergen Reactions  . Codeine     Anxious,intolerence,nervous,mainly cold medicine  . Statins Other (See Comments)    MYALGIAS    Assessment/Plan:  1. Hyperlipidemia -  LDL above goal of <70 mg/dL on 07/23/92. Patient is interested in lipid lowering therapy to reduce risk of CV event. Considering CAD, elevated triglycerides, and history of myalgia with atorvastatin, initiate rosuvastatin 5 mg daily. Discussed efficacy of statins and differences between atorvastatin and rosuvastatin that may make rosuvastatin more tolerable. Patient understands to contact us if he does not tolerate rosuvastatin initiation. Discussed PCSK-9i efficacy and cost as a potential additional therapy. Triglycerides elevated on 09/08/19. Discussed  maintaining dietary changes including decreased sugar intake and changing to whole wheat bread.  Repeat lipid panel in 6 weeks. Consider uptitration to rosuvastatin 10 mg daily if patient tolerates initiation. May consider Repatha (copay $47/mo) if patient does not tolerate rosuvastatin initiation or as an add on therapy if LDL is not at goal with max tolerated statin.  Thank you, Idamae Lusher  PharmD Candidate, Class of 2022  Olene Floss, Pharm.D, BCPS, CPP [facility]  [address] [place], [place] [address]  Phone: [phone]; Fax: [phone]

## 2019-12-02 NOTE — Telephone Encounter (Signed)
I do not see anything documented in chart from where someone called patient. Seems to be a reminder call. AS, CMA

## 2019-12-07 ENCOUNTER — Other Ambulatory Visit: Payer: Self-pay

## 2019-12-07 ENCOUNTER — Ambulatory Visit: Payer: Medicare PPO | Admitting: Physician Assistant

## 2019-12-07 ENCOUNTER — Encounter: Payer: Self-pay | Admitting: Physician Assistant

## 2019-12-07 VITALS — BP 122/67 | HR 64 | Temp 98.0°F | Ht 67.0 in | Wt 233.6 lb

## 2019-12-07 DIAGNOSIS — Z23 Encounter for immunization: Secondary | ICD-10-CM | POA: Diagnosis not present

## 2019-12-07 DIAGNOSIS — I2510 Atherosclerotic heart disease of native coronary artery without angina pectoris: Secondary | ICD-10-CM

## 2019-12-07 DIAGNOSIS — J329 Chronic sinusitis, unspecified: Secondary | ICD-10-CM | POA: Diagnosis not present

## 2019-12-07 DIAGNOSIS — E782 Mixed hyperlipidemia: Secondary | ICD-10-CM

## 2019-12-07 NOTE — Assessment & Plan Note (Addendum)
-  Followed by cardiology/lipid clinic. -Last lipid panel: total cholesterol 230, triglycerides 269, HDL 33, LDL 147 -Recommend to continue Crestor. -Continue with dietary changes and continue to reduce saturated and trans fats.  Monitor simple carbohydrates. -Stay as active as possible.

## 2019-12-07 NOTE — Progress Notes (Signed)
Established Patient Office Visit  Subjective:  Patient ID: Martin Norris, male    DOB: 12/28/1950  Age: 68 y.o. MRN: [id number]  CC:  Chief Complaint  Patient presents with  . Hyperlipidemia    HPI Martin Norris presents for follow-up on hyperlipidemia and chronic sinusitis.  Has no acute concerns today.  States currently does not have any sinus symptoms.  HLD: Pt recently had consultation with lipid clinic and was started on Crestor 5 mg for hyperlipidemia.  Patient reports so far is tolerating medication without issues.  Denies side effects of arthralgias or myalgias.  Also reports has made dietary changes by reducing fried foods.  CAD: Followed by cardiology.  Currently denies chest pain, palpitations, shortness of breath, dizziness or lower extremity edema.  Has an upcoming appointment with Dr. Anne Fu to discuss possible stent.   Past Medical History:  Diagnosis Date  . Chronic kidney disease    KIDNEY STONES  . Dizziness   . Hyperlipidemia   . Migraines   . Vertigo     Past Surgical History:  Procedure Laterality Date  . KNEE ARTHROSCOPY Left   . LEFT HEART CATH AND CORONARY ANGIOGRAPHY N/A 11/23/2019   Procedure: LEFT HEART CATH AND CORONARY ANGIOGRAPHY;  Surgeon: Lyn Records, MD;  Location: [facility];  Service: Cardiovascular;  Laterality: N/A;    Family History  Problem Relation Age of Onset  . Healthy Mother   . Macular degeneration Mother   . Heart disease Father   . Stroke Father   . Hyperlipidemia Father   . COPD Sister   . Hypertension Brother   . Hyperlipidemia Brother   . Hypertension Sister   . Hyperlipidemia Brother   . Parkinson's disease Paternal Grandfather   . Stroke Paternal Grandfather   . Heart failure Paternal Grandfather     Social History   Socioeconomic History  . Marital status: Married    Spouse name: [name]  . Number of children: Not on file  . Years of education: Not on file  . Highest education level: 10th  grade  Occupational History    Comment: retired AC/heat maintenance  Tobacco Use  . Smoking status: Former Smoker    Packs/day: 2.00    Years: 30.00    Pack years: 60.00    Types: Cigarettes    Quit date: 01/28/1989    Years since quitting: 30.8  . Smokeless tobacco: Never Used  Vaping Use  . Vaping Use: Never used  Substance and Sexual Activity  . Alcohol use: Yes    Alcohol/week: 2.0 standard drinks    Types: 2 Cans of beer per week    Comment: weekends  . 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 Sexual activity: Yes    Birth control/protection: None  Other Topics Concern  . Not on file  Social History Narrative   Lives with wife   Caffeine- coffee 1 c, Coke 1 daily, tea 2-3 daily   Social Determinants of Health   Financial Resource Strain:   . Difficulty of Paying Living Expenses: Not on file  Food Insecurity:   . Worried About Programme researcher, broadcasting/film/video in the Last Year: Not on file  . Ran Out of Food in the Last Year: Not on file  Transportation Needs:   . Lack of Transportation (Medical): Not on file  . Lack of Transportation (Non-Medical): Not on file  Physical Activity:   .  Days of Exercise per Week: Not on file  . Minutes of Exercise per Session: Not on file  Stress:   . Feeling of Stress : Not on file  Social Connections:   . Frequency of Communication with Friends and Family: Not on file  . Frequency of Social Gatherings with Friends and Family: Not on file  . Attends Religious Services: Not on file  . Active Member of Clubs or Organizations: Not on file  . Attends Banker Meetings: Not on file  . Marital Status: Not on file  Intimate Partner Violence:   . Fear of Current or Ex-Partner: Not on file  . Emotionally Abused: Not on file  . Physically Abused: Not on file  . Sexually Abused: Not on file    Outpatient Medications Prior to Visit  Medication Sig Dispense Refill  . aspirin EC 81 MG tablet Take 1 tablet (81 mg  total) by mouth daily. Swallow whole.    . famotidine (PEPCID) 10 MG tablet Take 10 mg by mouth daily as needed for heartburn or indigestion.    . Ibuprofen (ADVIL PO) Take 1 tablet by mouth 3 (three) times daily as needed (pain). Duo    . rosuvastatin (CRESTOR) 5 MG tablet Take 1 tablet (5 mg total) by mouth daily. 90 tablet 3  . metoprolol tartrate (LOPRESSOR) 100 MG tablet Take 1 tablet (100 mg total) by mouth once for 1 dose. Take 1 tablet 2 hours before your CT scan (Patient not taking: Reported on 11/17/2019) 1 tablet 0   No facility-administered medications prior to visit.    Allergies  Allergen Reactions  . Codeine     Anxious,intolerence,nervous,mainly cold medicine  . Statins Other (See Comments)    MYALGIAS    ROS Review of Systems A fourteen system review of systems was performed and found to be positive as per HPI.  Objective:    Physical Exam General:  Well Developed, well nourished, in no acute distress Neuro:  Alert and oriented,  extra-ocular muscles intact, no focal deficits   HEENT:  Normocephalic, atraumatic, neck supple, Skin:  no gross rash, warm, pink. Cardiac:  RRR, S1 S2, no murmur Respiratory:  ECTA B/L and A/P, Not using accessory muscles, speaking in full sentences- unlabored. Vascular:  Ext warm, no cyanosis apprec.; cap RF less 2 sec. Psych:  No HI/SI, judgement and insight good, Euthymic mood. Full Affect.   BP 122/67   Pulse 64   Temp 98 F (36.7 C)   Ht 5\' 7"  (1.702 m)   Wt 233 lb 9.6 oz (106 kg)   SpO2 99%   BMI 36.59 kg/m  Wt Readings from Last 3 Encounters:  12/07/19 233 lb 9.6 oz (106 kg)  11/23/19 234 lb (106.1 kg)  11/17/19 234 lb (106.1 kg)     Health Maintenance Due  Topic Date Due  . COLONOSCOPY  Never done  . PNA vac Low Risk Adult (1 of 2 - PCV13) Never done    There are no preventive care reminders to display for this patient.  Lab Results  Component Value Date   TSH 1.930 10/23/2017   Lab Results  Component  Value Date   WBC 9.1 11/17/2019   HGB 17.0 11/17/2019   HCT 49.4 11/17/2019   MCV 88 11/17/2019   PLT 276 11/17/2019   Lab Results  Component Value Date   NA 138 11/17/2019   K 4.4 11/17/2019   CO2 25 11/17/2019   GLUCOSE 96 11/17/2019   BUN  14 11/17/2019   CREATININE 0.94 11/17/2019   BILITOT 0.6 09/08/2019   ALKPHOS 68 09/08/2019   AST 30 09/08/2019   ALT 26 09/08/2019   PROT 7.0 09/08/2019   ALBUMIN 4.6 09/08/2019   CALCIUM 9.8 11/17/2019   Lab Results  Component Value Date   CHOL 230 (H) 09/08/2019   Lab Results  Component Value Date   HDL 33 (L) 09/08/2019   Lab Results  Component Value Date   LDLCALC 147 (H) 09/08/2019   Lab Results  Component Value Date   TRIG 269 (H) 09/08/2019   Lab Results  Component Value Date   CHOLHDL 7.0 (H) 09/08/2019   Lab Results  Component Value Date   HGBA1C 5.5 09/08/2019      Assessment & Plan:   Problem List Items Addressed This Visit      Cardiovascular and Mediastinum   CAD (coronary artery disease), native coronary artery    -Followed by Cardiology. -11/23/19 Left heart catheterization and coronary angiogram results:   Patent left main  Moderately severe proximal and mid LAD disease.  Proximal and mid stenoses are 60 and 75% respectively.  80 to 90% first diagonal, and large second diagonal contains 50% ostial narrowing forming a Medina 111 bifurcation stenosis with the mid LAD stenosis.  Diagonal and continuation of LAD are equal in size.  Large second obtuse marginal is functionally occluded and receives right to left collaterals from the PDA.  Collaterals appear to be well formed suggesting this is been present for quite some time.  Dominant right coronary.  Large branching PDA.  Both limbs beyond the branch point contain 40 to 50% narrowing.  The PDA gives well-formed collaterals to the obtuse marginal  Low normal LV function.  EF 50%.  LVEDP is normal.         Other   Hyperlipidemia    -Followed by  cardiology/lipid clinic. -Last lipid panel: total cholesterol 230, triglycerides 269, HDL 33, LDL 147 -Recommend to continue Crestor. -Continue with dietary changes and continue to reduce saturated and trans fats.  Monitor simple carbohydrates. -Stay as active as possible.       Other Visit Diagnoses    Need for influenza vaccination    -  Primary   Relevant Orders   Flu vaccine HIGH DOSE PF (Fluzone High dose) (Completed)   Chronic sinusitis, unspecified location         Chronic Sinusitis: -Currently asymptomatic.   No orders of the defined types were placed in this encounter.   Follow-up: Return in about 4 months (around 04/05/2020) for [facility].   Note:  This note was prepared with assistance of Dragon voice recognition software. Occasional wrong-word or sound-a-like substitutions may have occurred due to the inherent limitations of voice recognition software.  Mayer Masker, PA-C

## 2019-12-07 NOTE — Patient Instructions (Signed)
DASH Eating Plan DASH stands for "Dietary Approaches to Stop Hypertension." The DASH eating plan is a healthy eating plan that has been shown to reduce high blood pressure (hypertension). It may also reduce your risk for type 2 diabetes, heart disease, and stroke. The DASH eating plan may also help with weight loss. What are tips for following this plan?  General guidelines  Avoid eating more than 2,300 mg (milligrams) of salt (sodium) a day. If you have hypertension, you may need to reduce your sodium intake to 1,500 mg a day.  Limit alcohol intake to no more than 1 drink a day for nonpregnant women and 2 drinks a day for men. One drink equals 12 oz of beer, 5 oz of wine, or 1 oz of hard liquor.  Work with your health care provider to maintain a healthy body weight or to lose weight. Ask what an ideal weight is for you.  Get at least 30 minutes of exercise that causes your heart to beat faster (aerobic exercise) most days of the week. Activities may include walking, swimming, or biking.  Work with your health care provider or diet and nutrition specialist (dietitian) to adjust your eating plan to your individual calorie needs. Reading food labels   Check food labels for the amount of sodium per serving. Choose foods with less than 5 percent of the Daily Value of sodium. Generally, foods with less than 300 mg of sodium per serving fit into this eating plan.  To find whole grains, look for the word "whole" as the first word in the ingredient list. Shopping  Buy products labeled as "low-sodium" or "no salt added."  Buy fresh foods. Avoid canned foods and premade or frozen meals. Cooking  Avoid adding salt when cooking. Use salt-free seasonings or herbs instead of table salt or sea salt. Check with your health care provider or pharmacist before using salt substitutes.  Do not fry foods. Cook foods using healthy methods such as baking, boiling, grilling, and broiling instead.  Cook with  heart-healthy oils, such as olive, canola, soybean, or sunflower oil. Meal planning  Eat a balanced diet that includes: ? 5 or more servings of fruits and vegetables each day. At each meal, try to fill half of your plate with fruits and vegetables. ? Up to 6-8 servings of whole grains each day. ? Less than 6 oz of lean meat, poultry, or fish each day. A 3-oz serving of meat is about the same size as a deck of cards. One egg equals 1 oz. ? 2 servings of low-fat dairy each day. ? A serving of nuts, seeds, or beans 5 times each week. ? Heart-healthy fats. Healthy fats called Omega-3 fatty acids are found in foods such as flaxseeds and coldwater fish, like sardines, salmon, and mackerel.  Limit how much you eat of the following: ? Canned or prepackaged foods. ? Food that is high in trans fat, such as fried foods. ? Food that is high in saturated fat, such as fatty meat. ? Sweets, desserts, sugary drinks, and other foods with added sugar. ? Full-fat dairy products.  Do not salt foods before eating.  Try to eat at least 2 vegetarian meals each week.  Eat more home-cooked food and less restaurant, buffet, and fast food.  When eating at a restaurant, ask that your food be prepared with less salt or no salt, if possible. What foods are recommended? The items listed may not be a complete list. Talk with your dietitian about   what dietary choices are best for you. Grains Whole-grain or whole-wheat bread. Whole-grain or whole-wheat pasta. Brown rice. Oatmeal. Quinoa. Bulgur. Whole-grain and low-sodium cereals. Pita bread. Low-fat, low-sodium crackers. Whole-wheat flour tortillas. Vegetables Fresh or frozen vegetables (raw, steamed, roasted, or grilled). Low-sodium or reduced-sodium tomato and vegetable juice. Low-sodium or reduced-sodium tomato sauce and tomato paste. Low-sodium or reduced-sodium canned vegetables. Fruits All fresh, dried, or frozen fruit. Canned fruit in natural juice (without  added sugar). Meat and other protein foods Skinless chicken or turkey. Ground chicken or turkey. Pork with fat trimmed off. Fish and seafood. Egg whites. Dried beans, peas, or lentils. Unsalted nuts, nut butters, and seeds. Unsalted canned beans. Lean cuts of beef with fat trimmed off. Low-sodium, lean deli meat. Dairy Low-fat (1%) or fat-free (skim) milk. Fat-free, low-fat, or reduced-fat cheeses. Nonfat, low-sodium ricotta or cottage cheese. Low-fat or nonfat yogurt. Low-fat, low-sodium cheese. Fats and oils Soft margarine without trans fats. Vegetable oil. Low-fat, reduced-fat, or light mayonnaise and salad dressings (reduced-sodium). Canola, safflower, olive, soybean, and sunflower oils. Avocado. Seasoning and other foods Herbs. Spices. Seasoning mixes without salt. Unsalted popcorn and pretzels. Fat-free sweets. What foods are not recommended? The items listed may not be a complete list. Talk with your dietitian about what dietary choices are best for you. Grains Baked goods made with fat, such as croissants, muffins, or some breads. Dry pasta or rice meal packs. Vegetables Creamed or fried vegetables. Vegetables in a cheese sauce. Regular canned vegetables (not low-sodium or reduced-sodium). Regular canned tomato sauce and paste (not low-sodium or reduced-sodium). Regular tomato and vegetable juice (not low-sodium or reduced-sodium). Pickles. Olives. Fruits Canned fruit in a light or heavy syrup. Fried fruit. Fruit in cream or butter sauce. Meat and other protein foods Fatty cuts of meat. Ribs. Fried meat. Bacon. Sausage. Bologna and other processed lunch meats. Salami. Fatback. Hotdogs. Bratwurst. Salted nuts and seeds. Canned beans with added salt. Canned or smoked fish. Whole eggs or egg yolks. Chicken or turkey with skin. Dairy Whole or 2% milk, cream, and half-and-half. Whole or full-fat cream cheese. Whole-fat or sweetened yogurt. Full-fat cheese. Nondairy creamers. Whipped toppings.  Processed cheese and cheese spreads. Fats and oils Butter. Stick margarine. Lard. Shortening. Ghee. Bacon fat. Tropical oils, such as coconut, palm kernel, or palm oil. Seasoning and other foods Salted popcorn and pretzels. Onion salt, garlic salt, seasoned salt, table salt, and sea salt. Worcestershire sauce. Tartar sauce. Barbecue sauce. Teriyaki sauce. Soy sauce, including reduced-sodium. Steak sauce. Canned and packaged gravies. Fish sauce. Oyster sauce. Cocktail sauce. Horseradish that you find on the shelf. Ketchup. Mustard. Meat flavorings and tenderizers. Bouillon cubes. Hot sauce and Tabasco sauce. Premade or packaged marinades. Premade or packaged taco seasonings. Relishes. Regular salad dressings. Where to find more information:  National Heart, Lung, and Blood Institute: www.nhlbi.nih.gov  American Heart Association: www.heart.org Summary  The DASH eating plan is a healthy eating plan that has been shown to reduce high blood pressure (hypertension). It may also reduce your risk for type 2 diabetes, heart disease, and stroke.  With the DASH eating plan, you should limit salt (sodium) intake to 2,300 mg a day. If you have hypertension, you may need to reduce your sodium intake to 1,500 mg a day.  When on the DASH eating plan, aim to eat more fresh fruits and vegetables, whole grains, lean proteins, low-fat dairy, and heart-healthy fats.  Work with your health care provider or diet and nutrition specialist (dietitian) to adjust your eating plan to your   individual calorie needs. This information is not intended to replace advice given to you by your health care provider. Make sure you discuss any questions you have with your health care provider. Document Revised: 12/27/2016 Document Reviewed: 01/08/2016 Elsevier Patient Education  2020 Elsevier Inc.  

## 2019-12-07 NOTE — Assessment & Plan Note (Addendum)
-  Followed by Cardiology. -11/23/19 Left heart catheterization and coronary angiogram results:   Patent left main  Moderately severe proximal and mid LAD disease.  Proximal and mid stenoses are 60 and 75% respectively.  80 to 90% first diagonal, and large second diagonal contains 50% ostial narrowing forming a Medina 111 bifurcation stenosis with the mid LAD stenosis.  Diagonal and continuation of LAD are equal in size.  Large second obtuse marginal is functionally occluded and receives right to left collaterals from the PDA.  Collaterals appear to be well formed suggesting this is been present for quite some time.  Dominant right coronary.  Large branching PDA.  Both limbs beyond the branch point contain 40 to 50% narrowing.  The PDA gives well-formed collaterals to the obtuse marginal  Low normal LV function.  EF 50%.  LVEDP is normal.

## 2019-12-09 DIAGNOSIS — H2513 Age-related nuclear cataract, bilateral: Secondary | ICD-10-CM | POA: Diagnosis not present

## 2019-12-09 DIAGNOSIS — H5319 Other subjective visual disturbances: Secondary | ICD-10-CM | POA: Diagnosis not present

## 2019-12-09 DIAGNOSIS — H43811 Vitreous degeneration, right eye: Secondary | ICD-10-CM | POA: Diagnosis not present

## 2019-12-17 ENCOUNTER — Other Ambulatory Visit: Payer: Self-pay

## 2019-12-17 ENCOUNTER — Encounter: Payer: Self-pay | Admitting: Cardiology

## 2019-12-17 ENCOUNTER — Ambulatory Visit (INDEPENDENT_AMBULATORY_CARE_PROVIDER_SITE_OTHER): Payer: Medicare PPO | Admitting: Cardiology

## 2019-12-17 VITALS — BP 116/70 | HR 76 | Ht 67.0 in | Wt 231.0 lb

## 2019-12-17 DIAGNOSIS — E782 Mixed hyperlipidemia: Secondary | ICD-10-CM | POA: Diagnosis not present

## 2019-12-17 DIAGNOSIS — I209 Angina pectoris, unspecified: Secondary | ICD-10-CM

## 2019-12-17 DIAGNOSIS — Z789 Other specified health status: Secondary | ICD-10-CM | POA: Diagnosis not present

## 2019-12-17 DIAGNOSIS — I2510 Atherosclerotic heart disease of native coronary artery without angina pectoris: Secondary | ICD-10-CM | POA: Diagnosis not present

## 2019-12-17 NOTE — Patient Instructions (Signed)
Medication Instructions:  The current medical regimen is effective;  continue present plan and medications.  *If you need a refill on your cardiac medications before your next appointment, please call your pharmacy*  Follow-Up: At [facility], you and your health needs are our priority.  As part of our continuing mission to provide you with exceptional heart care, we have created designated Provider Care Teams.  These Care Teams include your primary Cardiologist (physician) and Advanced Practice Providers (APPs -  Physician Assistants and Nurse Practitioners) who all work together to provide you with the care you need, when you need it.  We recommend signing up for the patient portal called "MyChart".  Sign up information is provided on this After Visit Summary.  MyChart is used to connect with patients for Virtual Visits (Telemedicine).  Patients are able to view lab/test results, encounter notes, upcoming appointments, etc.  Non-urgent messages can be sent to your provider as well.   To learn more about what you can do with MyChart, go to [email or web address].    Your next appointment:   4 month(s)  The format for your next appointment:   In Person  Provider:   Mark Skains, MD   Thank you for choosing [facility]!!     

## 2019-12-17 NOTE — Progress Notes (Signed)
Cardiology Office Note:    Date:  12/17/2019   ID:  Martin Norris, DOB 07-02-1950, MRN [id number]  PCP:  Martin Masker, PA-C  [facility] Cardiologist:  Martin Schultz, MD  [facility] Electrophysiologist:  None   Referring MD: Martin Masker, PA-C     History of Present Illness:    Martin Norris is a 69 y.o. male here for the follow-up of coronary artery disease. He had a CT of his coronary arteries with abnormal FFR.  Has been experiencing substernal chest discomfort quite severe at times which she has been attributing to his hiatal hernia.  Martin Norris, his son works in [employer] at [employer].  His son had 4 stents.  Father had bypass and stroke.  Wife had heart catheterization ended up with groin bleed.  His LDL was 147 not able to take statins. Mid LAD flow-limiting disease at diagonal 2 was noted on FFR analysis, also mid circumflex was occluded and RCA appeared patent.  His cardiac catheterization as described below showed the large second obtuse marginal that is functionally occluded receiving right to left collaterals.  He also demonstrated mid stenosis of up to 75% as well as 80 to 90% first diagonal.  EF was 50%.  It was thought that if medical management did not work, he could consider CTO.  Dr. Eldridge Dace and I discussed.  Currently he feels well.  He change his diet, decreased 95% of the caffeine he was drinking.  He is not having any further chest discomfort he states.  He was in the yard splitting wood all day without any difficulty.  No recurrent angina.  Cardiac cath 11/23/19  Patent left main  Moderately severe proximal and mid LAD disease.  Proximal and mid stenoses are 60 and 75% respectively.  80 to 90% first diagonal, and large second diagonal contains 50% ostial narrowing forming a Medina 111 bifurcation stenosis with the mid LAD stenosis.  Diagonal and continuation of LAD are equal in size.  Large second obtuse marginal is functionally occluded  and receives right to left collaterals from the PDA.  Collaterals appear to be well formed suggesting this is been present for quite some time.  Dominant right coronary.  Large branching PDA.  Both limbs beyond the branch point contain 40 to 50% narrowing.  The PDA gives well-formed collaterals to the obtuse marginal  Low normal LV function.  EF 50%.  LVEDP is normal.  RECOMMENDATIONS:   Aggressive risk factor modification including PCSK9 therapy.  Medical management of current coronary anatomy and if he becomes symptomatic consider CABG to include the diagonals LAD and obtuse marginal versus higher risk stent of the LAD (due to jailing of large diagonals). Dominance: Right    Past Medical History:  Diagnosis Date   Chronic kidney disease    KIDNEY STONES   Dizziness    Hyperlipidemia    Migraines    Vertigo     Past Surgical History:  Procedure Laterality Date   KNEE ARTHROSCOPY Left    LEFT HEART CATH AND CORONARY ANGIOGRAPHY N/A 11/23/2019   Procedure: LEFT HEART CATH AND CORONARY ANGIOGRAPHY;  Surgeon: Lyn Records, MD;  Location: [facility];  Service: Cardiovascular;  Laterality: N/A;    Current Medications: Current Meds  Medication Sig   aspirin EC 81 MG tablet Take 1 tablet (81 mg total) by mouth daily. Swallow whole.   famotidine (PEPCID) 10 MG tablet Take 10 mg by mouth daily as needed for heartburn or indigestion.  Ibuprofen (ADVIL PO) Take 1 tablet by mouth 3 (three) times daily as needed (pain). Duo   rosuvastatin (CRESTOR) 5 MG tablet Take 1 tablet (5 mg total) by mouth daily.     Allergies:   Codeine and Statins   Social History   Socioeconomic History   Marital status: Married    Spouse name: Martin Norris   Number of children: Not on file   Years of education: Not on file   Highest education level: 10th grade  Occupational History    Comment: retired Materials engineer maintenance  Tobacco Use   Smoking status: Former Smoker     Packs/day: 2.00    Years: 30.00    Pack years: 60.00    Types: Cigarettes    Quit date: 01/28/1989    Years since quitting: 30.9   Smokeless tobacco: Never Used  Vaping Use   Vaping Use: Never used  Substance and Sexual Activity   Alcohol use: Yes    Alcohol/week: 2.0 standard drinks    Types: 2 Cans of beer per week    Comment: weekends   Drug use: Yes    Types: Marijuana    Comment: CASUALLY, 1 x yr   Sexual activity: Yes    Birth control/protection: None  Other Topics Concern   Not on file  Social History Narrative   Lives with wife   Caffeine- coffee 1 c, Coke 1 daily, tea 2-3 daily   Social Determinants of Health   Financial Resource Strain:    Difficulty of Paying Living Expenses: Not on file  Food Insecurity:    Worried About Programme researcher, broadcasting/film/video in the Last Year: Not on file   [employer] of Food in the Last Year: Not on file  Transportation Needs:    Lack of Transportation (Medical): Not on file   Lack of Transportation (Non-Medical): Not on file  Physical Activity:    Days of Exercise per Week: Not on file   Minutes of Exercise per Session: Not on file  Stress:    Feeling of Stress : Not on file  Social Connections:    Frequency of Communication with Friends and Family: Not on file   Frequency of Social Gatherings with Friends and Family: Not on file   Attends Religious Services: Not on file   Active Member of Clubs or Organizations: Not on file   Attends Banker Meetings: Not on file   Marital Status: Not on file     Family History: The patient's family history includes COPD in his sister; Healthy in his mother; Heart disease in his father; Heart failure in his paternal grandfather; Hyperlipidemia in his brother, brother, and father; Hypertension in his brother and sister; Macular degeneration in his mother; Parkinson's disease in his paternal grandfather; Stroke in his father and paternal grandfather.    Recent  Labs: 09/08/2019: ALT 26 11/17/2019: BUN 14; Creatinine, Ser 0.94; Hemoglobin 17.0; Platelets 276; Potassium 4.4; Sodium 138  Recent Lipid Panel    Component Value Date/Time   CHOL 230 (H) 09/08/2019 1037   TRIG 269 (H) 09/08/2019 1037   HDL 33 (L) 09/08/2019 1037   CHOLHDL 7.0 (H) 09/08/2019 1037   LDLCALC 147 (H) 09/08/2019 1037    Physical Exam:    VS:  BP 116/70    Pulse 76    Ht 5\' 7"  (1.702 m)    Wt 231 lb (104.8 kg)    SpO2 97%    BMI 36.18 kg/m     Wt Readings  from Last 3 Encounters:  12/17/19 231 lb (104.8 kg)  12/07/19 233 lb 9.6 oz (106 kg)  11/23/19 234 lb (106.1 kg)     GEN:  Well nourished, well developed in no acute distress HEENT: Normal NECK: No JVD; No carotid bruits LYMPHATICS: No lymphadenopathy CARDIAC: RRR, no murmurs, rubs, gallops RESPIRATORY:  Clear to auscultation without rales, wheezing or rhonchi  ABDOMEN: Soft, non-tender, non-distended MUSCULOSKELETAL:  No edema; No deformity  SKIN: Warm and dry NEUROLOGIC:  Alert and oriented x 3 PSYCHIATRIC:  Normal affect   ASSESSMENT:    1. Coronary artery disease involving native coronary artery of native heart without angina pectoris   2. Mixed hyperlipidemia   3. Angina pectoris (HCC)   4. Statin intolerance    PLAN:    In order of problems listed above:  Coronary artery disease -Currently doing very well.  No anginal symptoms. -We discussed the possibility of CTO of second obtuse marginal branch.  I discussed with Dr. Eldridge Dace and he does believe that it is favorable to open.  Since he is not having any anginal symptoms currently, we will hold off on any further procedure. -Continue with aspirin, statin. -I talked him about the possibility of isosorbide.  Since he is not having any active symptoms we will hold off.  He has nitroglycerin.  Hyperlipidemia with prior statin intolerance -He seems to be doing okay with the Crestor 5 mg once a day without any myalgias.  He is going to be seeing the  lipid clinic once again soon.  Excellent.  Appreciate their help.  LDL goal less than 70.  Medication Adjustments/Labs and Tests Ordered: Current medicines are reviewed at length with the patient today.  Concerns regarding medicines are outlined above.  No orders of the defined types were placed in this encounter.  No orders of the defined types were placed in this encounter.   Patient Instructions  Medication Instructions:  The current medical regimen is effective;  continue present plan and medications.  *If you need a refill on your cardiac medications before your next appointment, please call your pharmacy*  Follow-Up: At [facility], you and your health needs are our priority.  As part of our continuing mission to provide you with exceptional heart care, we have created designated Provider Care Teams.  These Care Teams include your primary Cardiologist (physician) and Advanced Practice Providers (APPs -  Physician Assistants and Nurse Practitioners) who all work together to provide you with the care you need, when you need it.  We recommend signing up for the patient portal called "MyChart".  Sign up information is provided on this After Visit Summary.  MyChart is used to connect with patients for Virtual Visits (Telemedicine).  Patients are able to view lab/test results, encounter notes, upcoming appointments, etc.  Non-urgent messages can be sent to your provider as well.   To learn more about what you can do with MyChart, go to [email or web address].    Your next appointment:   4 month(s)  The format for your next appointment:   In Person  Provider:   Donato Schultz, MD   Thank you for choosing [facility]!!        Signed, Martin Schultz, MD  12/17/2019 3:59 PM    [facility]

## 2019-12-30 DIAGNOSIS — M25562 Pain in left knee: Secondary | ICD-10-CM | POA: Diagnosis not present

## 2019-12-30 DIAGNOSIS — M25561 Pain in right knee: Secondary | ICD-10-CM | POA: Diagnosis not present

## 2019-12-30 DIAGNOSIS — M170 Bilateral primary osteoarthritis of knee: Secondary | ICD-10-CM | POA: Diagnosis not present

## 2020-01-03 DIAGNOSIS — M179 Osteoarthritis of knee, unspecified: Secondary | ICD-10-CM | POA: Insufficient documentation

## 2020-01-03 DIAGNOSIS — M1710 Unilateral primary osteoarthritis, unspecified knee: Secondary | ICD-10-CM | POA: Insufficient documentation

## 2020-01-06 DIAGNOSIS — M179 Osteoarthritis of knee, unspecified: Secondary | ICD-10-CM | POA: Diagnosis not present

## 2020-01-06 DIAGNOSIS — M170 Bilateral primary osteoarthritis of knee: Secondary | ICD-10-CM | POA: Diagnosis not present

## 2020-01-13 ENCOUNTER — Other Ambulatory Visit: Payer: Medicare PPO

## 2020-01-13 ENCOUNTER — Other Ambulatory Visit: Payer: Self-pay

## 2020-01-13 DIAGNOSIS — M170 Bilateral primary osteoarthritis of knee: Secondary | ICD-10-CM | POA: Diagnosis not present

## 2020-01-13 DIAGNOSIS — M179 Osteoarthritis of knee, unspecified: Secondary | ICD-10-CM | POA: Diagnosis not present

## 2020-01-13 DIAGNOSIS — E782 Mixed hyperlipidemia: Secondary | ICD-10-CM | POA: Diagnosis not present

## 2020-01-13 LAB — LIPID PANEL
Chol/HDL Ratio: 3.2 ratio (ref 0.0–5.0)
Cholesterol, Total: 141 mg/dL (ref 100–199)
HDL: 44 mg/dL (ref >39)
LDL Chol Calc (NIH): 78 mg/dL (ref 0–99)
Triglycerides: 102 mg/dL (ref 0–149)
VLDL Cholesterol Cal: 19 mg/dL (ref 5–40)

## 2020-01-13 LAB — HEPATIC FUNCTION PANEL
ALT: 28 IU/L (ref 0–44)
AST: 24 IU/L (ref 0–40)
Albumin: 4.3 g/dL (ref 3.8–4.8)
Alkaline Phosphatase: 61 IU/L (ref 44–121)
Bilirubin Total: 0.6 mg/dL (ref 0.0–1.2)
Bilirubin, Direct: 0.18 mg/dL (ref 0.00–0.40)
Total Protein: 6.1 g/dL (ref 6.0–8.5)

## 2020-01-14 DIAGNOSIS — M7121 Synovial cyst of popliteal space [Baker], right knee: Secondary | ICD-10-CM | POA: Diagnosis not present

## 2020-01-17 ENCOUNTER — Telehealth: Payer: Self-pay | Admitting: Cardiology

## 2020-01-17 MED ORDER — ROSUVASTATIN CALCIUM 10 MG PO TABS: 10.0000 mg | ORAL_TABLET | Freq: Every day | ORAL | 3 refills | Status: DC

## 2020-01-17 NOTE — Telephone Encounter (Signed)
Follow Up: ° ° ° ° ° °Returning your call from Friday. °

## 2020-01-17 NOTE — Telephone Encounter (Signed)
LDL 78 on Crestor 5mg  once a day  Excellent results.  If he is feeling well with this dose, I would like him to try Crestor 10mg  once a day to further reduce LDL to < 70, goal.   Great job.  , MD   Pt aware of results, is agreeable to increase Crestor to 10 mg daily and requests rx be sent into [facility] in [facility].  Rx sent electronically as requested.

## 2020-02-24 DIAGNOSIS — M170 Bilateral primary osteoarthritis of knee: Secondary | ICD-10-CM | POA: Diagnosis not present

## 2020-04-05 ENCOUNTER — Encounter: Payer: Self-pay | Admitting: Physician Assistant

## 2020-04-05 ENCOUNTER — Telehealth: Payer: Self-pay | Admitting: Physician Assistant

## 2020-04-05 ENCOUNTER — Ambulatory Visit (INDEPENDENT_AMBULATORY_CARE_PROVIDER_SITE_OTHER): Payer: Medicare PPO | Admitting: Physician Assistant

## 2020-04-05 ENCOUNTER — Other Ambulatory Visit: Payer: Self-pay

## 2020-04-05 VITALS — BP 126/75 | HR 58 | Temp 97.3°F | Ht 67.0 in | Wt 235.0 lb

## 2020-04-05 DIAGNOSIS — Z0000 Encounter for general adult medical examination without abnormal findings: Secondary | ICD-10-CM

## 2020-04-05 DIAGNOSIS — F17210 Nicotine dependence, cigarettes, uncomplicated: Secondary | ICD-10-CM

## 2020-04-05 DIAGNOSIS — Z122 Encounter for screening for malignant neoplasm of respiratory organs: Secondary | ICD-10-CM

## 2020-04-05 DIAGNOSIS — Z1211 Encounter for screening for malignant neoplasm of colon: Secondary | ICD-10-CM

## 2020-04-05 DIAGNOSIS — Z87891 Personal history of nicotine dependence: Secondary | ICD-10-CM

## 2020-04-05 NOTE — Telephone Encounter (Signed)
Martin Norris is aware. AS, CMA

## 2020-04-05 NOTE — Patient Instructions (Signed)
Preventive Care 65 Years and Older, Male Preventive care refers to lifestyle choices and visits with your health care provider that can promote health and wellness. This includes:  A yearly physical exam. This is also called an annual wellness visit.  Regular dental and eye exams.  Immunizations.  Screening for certain conditions.  Healthy lifestyle choices, such as: ? Eating a healthy diet. ? Getting regular exercise. ? Not using drugs or products that contain nicotine and tobacco. ? Limiting alcohol use. What can I expect for my preventive care visit? Physical exam Your health care provider will check your:  Height and weight. These may be used to calculate your BMI (body mass index). BMI is a measurement that tells if you are at a healthy weight.  Heart rate and blood pressure.  Body temperature.  Skin for abnormal spots. Counseling Your health care provider may ask you questions about your:  Past medical problems.  Family's medical history.  Alcohol, tobacco, and drug use.  Emotional well-being.  Home life and relationship well-being.  Sexual activity.  Diet, exercise, and sleep habits.  History of falls.  Memory and ability to understand (cognition).  Work and work environment.  Access to firearms. What immunizations do I need? Vaccines are usually given at various ages, according to a schedule. Your health care provider will recommend vaccines for you based on your age, medical history, and lifestyle or other factors, such as travel or where you work.   What tests do I need? Blood tests  Lipid and cholesterol levels. These may be checked every 5 years, or more often depending on your overall health.  Hepatitis C test.  Hepatitis B test. Screening  Lung cancer screening. You may have this screening every year starting at age 55 if you have a 30-pack-year history of smoking and currently smoke or have quit within the past 15 years.  Colorectal  cancer screening. ? All adults should have this screening starting at age 50 and continuing until age 75. ? Your health care provider may recommend screening at age 45 if you are at increased risk. ? You will have tests every 1-10 years, depending on your results and the type of screening test.  Prostate cancer screening. Recommendations will vary depending on your family history and other risks.  Genital exam to check for testicular cancer or hernias.  Diabetes screening. ? This is done by checking your blood sugar (glucose) after you have not eaten for a while (fasting). ? You may have this done every 1-3 years.  Abdominal aortic aneurysm (AAA) screening. You may need this if you are a current or former smoker.  STD (sexually transmitted disease) testing, if you are at risk. Follow these instructions at home: Eating and drinking  Eat a diet that includes fresh fruits and vegetables, whole grains, lean protein, and low-fat dairy products. Limit your intake of foods with high amounts of sugar, saturated fats, and salt.  Take vitamin and mineral supplements as recommended by your health care provider.  Do not drink alcohol if your health care provider tells you not to drink.  If you drink alcohol: ? Limit how much you have to 0-2 drinks a day. ? Be aware of how much alcohol is in your drink. In the [place] one drink equals one 12 oz bottle of beer (355 mL), one 5 oz glass of wine (148 mL), or one 1 oz glass of hard liquor (44 mL).   Lifestyle  Take daily care of your teeth   and gums. Brush your teeth every morning and night with fluoride toothpaste. Floss one time each day.  Stay active. Exercise for at least 30 minutes 5 or more days each week.  Do not use any products that contain nicotine or tobacco, such as cigarettes, e-cigarettes, and chewing tobacco. If you need help quitting, ask your health care provider.  Do not use drugs.  If you are sexually active, practice safe sex.  Use a condom or other form of protection to prevent STIs (sexually transmitted infections).  Talk with your health care provider about taking a low-dose aspirin or statin.  Find healthy ways to cope with stress, such as: ? Meditation, yoga, or listening to music. ? Journaling. ? Talking to a trusted person. ? Spending time with friends and family. Safety  Always wear your seat belt while driving or riding in a vehicle.  Do not drive: ? If you have been drinking alcohol. Do not ride with someone who has been drinking. ? When you are tired or distracted. ? While texting.  Wear a helmet and other protective equipment during sports activities.  If you have firearms in your house, make sure you follow all gun safety procedures. What's next?  Visit your health care provider once a year for an annual wellness visit.  Ask your health care provider how often you should have your eyes and teeth checked.  Stay up to date on all vaccines. This information is not intended to replace advice given to you by your health care provider. Make sure you discuss any questions you have with your health care provider. Document Revised: 10/13/2018 Document Reviewed: 01/08/2018 Elsevier Patient Education  2021 Elsevier Inc.  

## 2020-04-05 NOTE — Telephone Encounter (Signed)
Patient does not qualify for a lung cancer screening due to he quit smoking thirty years ago. [facility] suggests he CT scan for chest with no lung cancer. Please advise, thanks.

## 2020-04-05 NOTE — Progress Notes (Signed)
Subjective:   Martin Norris is a 70 y.o. male who presents for Medicare Annual/Subsequent preventive examination.  Review of Systems    General:   No F/C, wt loss Pulm:   No DIB, SOB, pleuritic chest pain Card:  No CP, palpitations Abd:  No n/v/d or pain Ext:  No inc edema from baseline    Objective:    Today's Vitals   04/05/20 0922  BP: 126/75  Pulse: (!) 58  Temp: (!) 97.3 F (36.3 C)  SpO2: 97%  Weight: 235 lb (106.6 kg)  Height: 5\' 7"  (1.702 m)   Body mass index is 36.81 kg/m.  Advanced Directives 11/23/2019 05/22/2017  Does Patient Have a Medical Advance Directive? No No  Would patient like information on creating a medical advance directive? No - Patient declined No - Patient declined    Current Medications (verified) Outpatient Encounter Medications as of 04/05/2020  Medication Sig  . aspirin EC 81 MG tablet Take 1 tablet (81 mg total) by mouth daily. Swallow whole.  . famotidine (PEPCID) 10 MG tablet Take 10 mg by mouth daily as needed for heartburn or indigestion.  . Ibuprofen (ADVIL PO) Take 1 tablet by mouth 3 (three) times daily as needed (pain). Duo  . rosuvastatin (CRESTOR) 10 MG tablet Take 1 tablet (10 mg total) by mouth daily.   No facility-administered encounter medications on file as of 04/05/2020.    Allergies (verified) Codeine and Statins   History: Past Medical History:  Diagnosis Date  . Arthritis    Phreesia 04/02/2020  . Chronic kidney disease    KIDNEY STONES  . Dizziness   . Hyperlipidemia   . Migraines   . Vertigo    Past Surgical History:  Procedure Laterality Date  . KNEE ARTHROSCOPY Left   . LEFT HEART CATH AND CORONARY ANGIOGRAPHY N/A 11/23/2019   Procedure: LEFT HEART CATH AND CORONARY ANGIOGRAPHY;  Surgeon: 11/25/2019, MD;  [facility];  Service: Cardiovascular;  Laterality: N/A;   Family History  Problem Relation Age of Onset  . Healthy Mother   . Macular degeneration Mother   . Heart disease  Father   . Stroke Father   . Hyperlipidemia Father   . COPD Sister   . Hypertension Brother   . Hyperlipidemia Brother   . Hypertension Sister   . Hyperlipidemia Brother   . Parkinson's disease Paternal Grandfather   . Stroke Paternal Grandfather   . Heart failure Paternal Grandfather    Social History   Socioeconomic History  . Marital status: Married    Spouse name: Martin Norris  . Number of children: Not on file  . Years of education: Not on file  . Highest education level: 10th grade  Occupational History    Comment: retired AC/heat maintenance  Tobacco Use  . Smoking status: Former Smoker    Packs/day: 2.00    Years: 30.00    Pack years: 60.00    Types: Cigarettes    Quit date: 01/28/1989    Years since quitting: 31.2  . Smokeless tobacco: Never Used  Vaping Use  . Vaping Use: Never used  Substance and Sexual Activity  . Alcohol use: Yes    Alcohol/week: 2.0 standard drinks    Types: 2 Cans of beer per week    Comment: weekends  . 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 Sexual activity: Yes    Birth control/protection: None  Other Topics Concern  . Not on file  Social History Narrative   Lives with wife   Caffeine- coffee 1 c, Coke 1 daily, tea 2-3 daily   Social Determinants of Health   Financial Resource Strain: Not on file  Food Insecurity: Not on file  Transportation Needs: Not on file  Physical Activity: Not on file  Stress: Not on file  Social Connections: Not on file    Tobacco Counseling Counseling given: Not Answered    Diabetic? No         Activities of Daily Living In your present state of health, do you have any difficulty performing the following activities: 04/05/2020 12/07/2019  Hearing? Martin Norris  Vision? Y Y  Difficulty concentrating or making decisions? N Y  Walking or climbing stairs? Y N  Dressing or bathing? N N  Doing errands, shopping? N N  Some recent data might be hidden    Patient Care  Team: Mayer Masker, PA-C as PCP - General (Physician Assistant) Jake Bathe, MD as PCP - Cardiology (Cardiology)  Indicate any recent Medical Services you may have received from other than [facility] providers in the past year (date may be approximate).     Assessment:   This is a routine wellness examination for Martin Norris.  Hearing/Vision screen No exam data present  Dietary issues and exercise activities discussed:  -Continue with diet changes (reduced greasy foods, drinking decaf coffee) and follow a heart healthy diet. Continue good hydration. Continue to stay as active as possible (B/L knee osteoarthritis).  Goals   None    Depression Screen PHQ 2/9 Scores 04/05/2020 12/07/2019 09/08/2019 10/23/2017 05/22/2017  PHQ - 2 Score 1 2 2  0 0  PHQ- 9 Score 3 4 5 2 2     Fall Risk Fall Risk  04/05/2020 12/07/2019 09/08/2019 10/23/2017 05/22/2017  Falls in the past year? 0 0 0 No No  Risk for fall due to : - - No Fall Risks - -  Follow up Falls evaluation completed Falls evaluation completed Falls evaluation completed - -    FALL RISK PREVENTION PERTAINING TO THE HOME:  Any stairs in or around the home? Yes  If so, are there any without handrails? Yes  Home free of loose throw rugs in walkways, pet beds, electrical cords, etc? Yes  Adequate lighting in your home to reduce risk of falls? Yes   ASSISTIVE DEVICES UTILIZED TO PREVENT FALLS:  Life alert? No  Use of a cane, walker or w/c? No  Grab bars in the bathroom? No  Shower chair or bench in shower? No  Elevated toilet seat or a handicapped toilet? No   TIMED UP AND GO:  Was the test performed? Yes .  Length of time to ambulate 10 feet:  sec.   Gait slow and steady without use of assistive device  Cognitive Function: wnl     6CIT Screen 04/05/2020  What Year? 0 points  What month? 0 points  What time? 0 points  Count back from 20 0 points  Months in reverse 0 points  Repeat phrase 0 points  Total Score 0     Immunizations Immunization History  Administered Date(s) Administered  . Influenza, High Dose Seasonal PF 12/07/2019  . Influenza-Unspecified 11/20/2017  . PFIZER(Purple Top)SARS-COV-2 Vaccination 02/18/2019, 03/11/2019, 10/29/2019  . Tdap 05/22/2017    TDAP status: Up to date  Flu Vaccine status: Up to date  Pneumococcal vaccine status: Declined,  Education has been provided regarding the importance of this  vaccine but patient still declined. Advised may receive this vaccine at local pharmacy or Health Dept. Aware to provide a copy of the vaccination record if obtained from local pharmacy or Health Dept. Verbalized acceptance and understanding.   Covid-19 vaccine status: Completed vaccines  Qualifies for Shingles Vaccine? No   Zostavax completed No   Shingrix Completed?: No.    Education has been provided regarding the importance of this vaccine. Patient has been advised to call insurance company to determine out of pocket expense if they have not yet received this vaccine. Advised may also receive vaccine at local pharmacy or Health Dept. Verbalized acceptance and understanding.  Screening Tests Health Maintenance  Topic Date Due  . COLONOSCOPY (Pts 45-64yrs Insurance coverage will need to be confirmed)  Never done  . PNA vac Low Risk Adult (1 of 2 - PCV13) Never done  . TETANUS/TDAP  05/23/2027  . INFLUENZA VACCINE  Completed  . COVID-19 Vaccine  Completed  . Hepatitis C Screening  Completed  . HPV VACCINES  Aged Out    Health Maintenance  Health Maintenance Due  Topic Date Due  . COLONOSCOPY (Pts 45-76yrs Insurance coverage will need to be confirmed)  Never done  . PNA vac Low Risk Adult (1 of 2 - PCV13) Never done    Colorectal cancer screening: Referral to GI placed today. Pt aware the office will call re: appt.  Lung Cancer Screening: (Low Dose CT Chest recommended if Age 60-80 years, 30 pack-year currently smoking OR have quit w/in 15years.) DOES qualify.    Lung Cancer Screening Referral: Ordered  Additional Screening:  Hepatitis C Screening: does not qualify; Completed patient declined  Vision Screening: Recommended annual ophthalmology exams for early detection of glaucoma and other disorders of the eye. Is the patient up to date with their annual eye exam?  Yes  Who is the provider or what is the name of the office in which the patient attends annual eye exams?  If pt is not established with a provider, would they like to be referred to a provider to establish care? Yes .   Dental Screening: Recommended annual dental exams for proper oral hygiene  Community Resource Referral / Chronic Care Management: CRR required this visit?  No   CCM required this visit?  No      Plan:  -Continue current medication regimen.  -Continue to follow up with cardiology. Patient will get labs with cardiology. -Follow up in 6 months for HLD  I have personally reviewed and noted the following in the patient's chart:   . Medical and social history . Use of alcohol, tobacco or illicit drugs  . Current medications and supplements . Functional ability and status . Nutritional status . Physical activity . Advanced directives . List of other physicians . Hospitalizations, surgeries, and ER visits in previous 12 months . Vitals . Screenings to include cognitive, depression, and falls . Referrals and appointments  In addition, I have reviewed and discussed with patient certain preventive protocols, quality metrics, and best practice recommendations. A written personalized care plan for preventive services as well as general preventive health recommendations were provided to patient.

## 2020-04-06 ENCOUNTER — Telehealth: Payer: Self-pay | Admitting: Physician Assistant

## 2020-04-06 NOTE — Telephone Encounter (Signed)
Per [facility] patient does not qualify for this imaging study because he d/c smoking in 1991. AS, CMA

## 2020-04-06 NOTE — Telephone Encounter (Signed)
Patient requires prior authorization for this referral [phone].  Please and thank you!

## 2020-04-13 ENCOUNTER — Other Ambulatory Visit: Payer: Self-pay

## 2020-04-13 ENCOUNTER — Ambulatory Visit: Payer: Medicare PPO | Admitting: Cardiology

## 2020-04-13 ENCOUNTER — Encounter: Payer: Self-pay | Admitting: Cardiology

## 2020-04-13 VITALS — BP 112/60 | HR 72 | Ht 67.0 in | Wt 229.0 lb

## 2020-04-13 DIAGNOSIS — E782 Mixed hyperlipidemia: Secondary | ICD-10-CM | POA: Diagnosis not present

## 2020-04-13 DIAGNOSIS — I2510 Atherosclerotic heart disease of native coronary artery without angina pectoris: Secondary | ICD-10-CM

## 2020-04-13 DIAGNOSIS — Z01812 Encounter for preprocedural laboratory examination: Secondary | ICD-10-CM

## 2020-04-13 NOTE — Progress Notes (Signed)
Cardiology Office Note:    Date:  04/13/2020   ID:  Martin Norris, DOB Aug 02, 1950, MRN [id number]  PCP:  Peggye Fothergill   [facility]  Cardiologist:  Donato Schultz, MD  Advanced Practice Provider:  No care team member to display Electrophysiologist:  None       Referring MD: Mayer Masker, PA-C     History of Present Illness:    Martin Norris is a 70 y.o. male for the follow-up of coronary artery disease.  Prior CT of his coronary arteries showed abnormal FFR.  He was experiencing substernal chest discomfort which he had been attributing to his hiatal hernia. Mid LAD flow-limiting disease at diagonal 2 was noted on FFR analysis, also mid circumflex was occluded and RCA appeared patent.  His cardiac catheterization as described below showed the large second obtuse marginal that is functionally occluded receiving right to left collaterals.  He also demonstrated mid stenosis of up to 75% as well as 80 to 90% first diagonal.  EF was 50%.  It was thought that if medical management did not work, he could consider CTO.  Dr. Eldridge Dace and I discussed.  His son [name] who works at [employer] at [employer], had 4 stents placed.  Father had bypass as well as stroke.  Previously his LDL was 147 not able to take statins.  Now however, he is tolerating Crestor 10 mg without any myalgias.  Checking lipid panel again today.    Past Medical History:  Diagnosis Date  . Arthritis    Phreesia 04/02/2020  . Chronic kidney disease    KIDNEY STONES  . Dizziness   . Hyperlipidemia   . Migraines   . Vertigo     Past Surgical History:  Procedure Laterality Date  . KNEE ARTHROSCOPY Left   . LEFT HEART CATH AND CORONARY ANGIOGRAPHY N/A 11/23/2019   Procedure: LEFT HEART CATH AND CORONARY ANGIOGRAPHY;  Surgeon: Lyn Records, MD;  Location: [facility];  Service: Cardiovascular;  Laterality: N/A;    Current Medications: Current Meds  Medication Sig   . aspirin EC 81 MG tablet Take 1 tablet (81 mg total) by mouth daily. Swallow whole.  . famotidine (PEPCID) 10 MG tablet Take 10 mg by mouth daily as needed for heartburn or indigestion.  . Ibuprofen (ADVIL PO) Take 1 tablet by mouth 3 (three) times daily as needed (pain). Duo  . rosuvastatin (CRESTOR) 10 MG tablet Take 1 tablet (10 mg total) by mouth daily.     Allergies:   Codeine and Statins   Social History   Socioeconomic History  . Marital status: Married    Spouse name: [name]  . Number of children: Not on file  . Years of education: Not on file  . Highest education level: 10th grade  Occupational History    Comment: retired AC/heat maintenance  Tobacco Use  . Smoking status: Former Smoker    Packs/day: 2.00    Years: 30.00    Pack years: 60.00    Types: Cigarettes    Quit date: 01/28/1989    Years since quitting: 31.2  . Smokeless tobacco: Never Used  Vaping Use  . Vaping Use: Never used  Substance and Sexual Activity  . Alcohol use: Yes    Alcohol/week: 2.0 standard drinks    Types: 2 Cans of beer per week    Comment: weekends  . Drug use: Yes    Types: Marijuana    Comment: CASUALLY,  1 x yr  . Sexual activity: Yes    Birth control/protection: None  Other Topics Concern  . Not on file  Social History Narrative   Lives with wife   Caffeine- coffee 1 c, Coke 1 daily, tea 2-3 daily   Social Determinants of Health   Financial Resource Strain: Not on file  Food Insecurity: Not on file  Transportation Needs: Not on file  Physical Activity: Not on file  Stress: Not on file  Social Connections: Not on file     Family History: The patient's family history includes COPD in his sister; Healthy in his mother; Heart disease in his father; Heart failure in his paternal grandfather; Hyperlipidemia in his brother, brother, and father; Hypertension in his brother and sister; Macular degeneration in his mother; Parkinson's disease in his paternal grandfather; Stroke in  his father and paternal grandfather.  ROS:   Please see the history of present illness.     All other systems reviewed and are negative.  EKGs/Labs/Other Studies Reviewed:    The following studies were reviewed today:   Cardiac cath 11/23/19  Patent left main  Moderately severe proximal and mid LAD disease. Proximal and mid stenoses are 60 and 75% respectively. 80 to 90% first diagonal, and large second diagonal contains 50% ostial narrowing forming a Medina 111 bifurcation stenosis with the mid LAD stenosis. Diagonal and continuation of LAD are equal in size.  Large second obtuse marginal is functionally occluded and receives right to left collaterals from the PDA. Collaterals appear to be well formed suggesting this is been present for quite some time.  Dominant right coronary. Large branching PDA. Both limbs beyond the branch point contain 40 to 50% narrowing. The PDA gives well-formed collaterals to the obtuse marginal  Low normal LV function. EF 50%. LVEDP is normal.  RECOMMENDATIONS:   Aggressive risk factor modification including PCSK9 therapy.  Medical management of current coronary anatomy and if he becomes symptomatic consider CABG to include the diagonals LAD and obtuse marginal versus higher risk stent of the LAD (due to jailing of large diagonals). Dominance: Right    Recent Labs: 11/17/2019: BUN 14; Creatinine, Ser 0.94; Hemoglobin 17.0; Platelets 276; Potassium 4.4; Sodium 138 01/13/2020: ALT 28  Recent Lipid Panel    Component Value Date/Time   CHOL 141 01/13/2020 0828   TRIG 102 01/13/2020 0828   HDL 44 01/13/2020 0828   CHOLHDL 3.2 01/13/2020 0828   LDLCALC 78 01/13/2020 0828     Risk Assessment/Calculations:      Physical Exam:    VS:  BP 112/60 (BP Location: Left Arm, Patient Position: Sitting, Cuff Size: Normal)   Pulse 72   Ht 5\' 7"  (1.702 m)   Wt 229 lb (103.9 kg)   SpO2 94%   BMI 35.87 kg/m     Wt Readings from Last 3  Encounters:  04/13/20 229 lb (103.9 kg)  04/05/20 235 lb (106.6 kg)  12/17/19 231 lb (104.8 kg)     GEN:  Well nourished, well developed in no acute distress HEENT: Normal NECK: No JVD; No carotid bruits LYMPHATICS: No lymphadenopathy CARDIAC: RRR, no murmurs, rubs, gallops RESPIRATORY:  Clear to auscultation without rales, wheezing or rhonchi  ABDOMEN: Soft, non-tender, non-distended MUSCULOSKELETAL:  No edema; No deformity  SKIN: Warm and dry NEUROLOGIC:  Alert and oriented x 3 PSYCHIATRIC:  Normal affect   ASSESSMENT:    1. Coronary artery disease involving native coronary artery of native heart without angina pectoris   2. Pre-procedure  lab exam   3. Mixed hyperlipidemia    PLAN:    In order of problems listed above:  Coronary artery disease -Discussed the possibility of CTO opening with Dr. Eldridge Dace.  May be favorable.  Currently not having any anginal symptoms.  Holding off on any further procedures at this time.  Continue with aspirin statin.  Has nitroglycerin as needed.  Hyperlipidemia with prior statin intolerance -Taking Crestor 10 mg daily without any significant myalgias.  Appreciate lipid clinic assistance.  Last LDL 78 in December 2021 (while on Crestor 5 mg a day).  Hemoglobin A1c 5.5 creatinine 0.9 -We will go ahead and check fasting lipid panel today.    Medication Adjustments/Labs and Tests Ordered: Current medicines are reviewed at length with the patient today.  Concerns regarding medicines are outlined above.  Orders Placed This Encounter  Procedures  . Lipid panel   No orders of the defined types were placed in this encounter.   Patient Instructions  Medication Instructions:  The current medical regimen is effective;  continue present plan and medications.  *If you need a refill on your cardiac medications before your next appointment, please call your pharmacy*  Lab Work: Please have blood work today (Lipid)  If you have labs (blood  work) drawn today and your tests are completely normal, you will receive your results only by: Marland Kitchen MyChart Message (if you have MyChart) OR . A paper copy in the mail If you have any lab test that is abnormal or we need to change your treatment, we will call you to review the results.  Follow-Up: At [facility] you and your health needs are our priority.  As part of our continuing mission to provide you with exceptional heart care, we have created designated Provider Care Teams.  These Care Teams include your primary Cardiologist (physician) and Advanced Practice Providers (APPs -  Physician Assistants and Nurse Practitioners) who all work together to provide you with the care you need, when you need it.  We recommend signing up for the patient portal called "MyChart".  Sign up information is provided on this After Visit Summary.  MyChart is used to connect with patients for Virtual Visits (Telemedicine).  Patients are able to view lab/test results, encounter notes, upcoming appointments, etc.  Non-urgent messages can be sent to your provider as well.   To learn more about what you can do with MyChart, go to [email or web address].    Your next appointment:   6 month(s)  The format for your next appointment:   In Person  Provider:   Donato Schultz, MD   Thank you for choosing [facility]!!        Signed, Donato Schultz, MD  04/13/2020 3:27 PM    [facility]

## 2020-04-13 NOTE — Patient Instructions (Signed)
Medication Instructions:  The current medical regimen is effective;  continue present plan and medications.  *If you need a refill on your cardiac medications before your next appointment, please call your pharmacy*  Lab Work: Please have blood work today (Lipid)  If you have labs (blood work) drawn today and your tests are completely normal, you will receive your results only by: Marland Kitchen MyChart Message (if you have MyChart) OR . A paper copy in the mail If you have any lab test that is abnormal or we need to change your treatment, we will call you to review the results.  Follow-Up: At [facility], you and your health needs are our priority.  As part of our continuing mission to provide you with exceptional heart care, we have created designated Provider Care Teams.  These Care Teams include your primary Cardiologist (physician) and Advanced Practice Providers (APPs -  Physician Assistants and Nurse Practitioners) who all work together to provide you with the care you need, when you need it.  We recommend signing up for the patient portal called "MyChart".  Sign up information is provided on this After Visit Summary.  MyChart is used to connect with patients for Virtual Visits (Telemedicine).  Patients are able to view lab/test results, encounter notes, upcoming appointments, etc.  Non-urgent messages can be sent to your provider as well.   To learn more about what you can do with MyChart, go to [email or web address].    Your next appointment:   6 month(s)  The format for your next appointment:   In Person  Provider:   Donato Schultz, MD   Thank you for choosing [facility]!!

## 2020-04-14 LAB — LIPID PANEL
Chol/HDL Ratio: 3.2 ratio (ref 0.0–5.0)
Cholesterol, Total: 130 mg/dL (ref 100–199)
HDL: 41 mg/dL (ref >39)
LDL Chol Calc (NIH): 66 mg/dL (ref 0–99)
Triglycerides: 129 mg/dL (ref 0–149)
VLDL Cholesterol Cal: 23 mg/dL (ref 5–40)

## 2020-04-18 ENCOUNTER — Encounter: Payer: Self-pay | Admitting: Gastroenterology

## 2020-04-18 ENCOUNTER — Encounter: Payer: Self-pay | Admitting: Physician Assistant

## 2020-05-03 ENCOUNTER — Other Ambulatory Visit: Payer: Self-pay | Admitting: Physician Assistant

## 2020-05-03 DIAGNOSIS — F17210 Nicotine dependence, cigarettes, uncomplicated: Secondary | ICD-10-CM

## 2020-05-10 ENCOUNTER — Other Ambulatory Visit: Payer: Self-pay

## 2020-05-10 ENCOUNTER — Ambulatory Visit (AMBULATORY_SURGERY_CENTER): Payer: Self-pay

## 2020-05-10 VITALS — Ht 67.0 in | Wt 234.4 lb

## 2020-05-10 DIAGNOSIS — Z86010 Personal history of colonic polyps: Secondary | ICD-10-CM

## 2020-05-10 MED ORDER — NA SULFATE-K SULFATE-MG SULF 17.5-3.13-1.6 GM/177ML PO SOLN: 1.0000 | Freq: Once | ORAL | 0 refills | Status: AC

## 2020-05-10 NOTE — Progress Notes (Signed)

## 2020-06-02 ENCOUNTER — Other Ambulatory Visit: Payer: Self-pay

## 2020-06-02 ENCOUNTER — Ambulatory Visit (AMBULATORY_SURGERY_CENTER): Payer: Medicare PPO | Admitting: Gastroenterology

## 2020-06-02 ENCOUNTER — Encounter: Payer: Self-pay | Admitting: Gastroenterology

## 2020-06-02 VITALS — BP 103/61 | HR 56 | Temp 97.8°F | Resp 14 | Ht 67.0 in | Wt 234.4 lb

## 2020-06-02 DIAGNOSIS — K219 Gastro-esophageal reflux disease without esophagitis: Secondary | ICD-10-CM | POA: Diagnosis not present

## 2020-06-02 DIAGNOSIS — Z86010 Personal history of colonic polyps: Secondary | ICD-10-CM

## 2020-06-02 DIAGNOSIS — E785 Hyperlipidemia, unspecified: Secondary | ICD-10-CM | POA: Diagnosis not present

## 2020-06-02 DIAGNOSIS — I2510 Atherosclerotic heart disease of native coronary artery without angina pectoris: Secondary | ICD-10-CM | POA: Diagnosis not present

## 2020-06-02 MED ORDER — SODIUM CHLORIDE 0.9 % IV SOLN: 500.0000 mL | Freq: Once | INTRAVENOUS | Status: DC

## 2020-06-02 NOTE — Progress Notes (Signed)
Report to PACU, RN, vss, BBS= Clear.  

## 2020-06-02 NOTE — Patient Instructions (Signed)
Follow a high fiber diet (see handout). Drink at least 64 ounces of water daily. Add a daily stool bulking agent such as psyllium (an example would be Metamucil).  YOU HAD AN ENDOSCOPIC PROCEDURE TODAY AT THE [facility]:   Refer to the procedure report that was given to you for any specific questions about what was found during the examination.  If the procedure report does not answer your questions, please call your gastroenterologist to clarify.  If you requested that your care partner not be given the details of your procedure findings, then the procedure report has been included in a sealed envelope for you to review at your convenience later.  YOU SHOULD EXPECT: Some feelings of bloating in the abdomen. Passage of more gas than usual.  Walking can help get rid of the air that was put into your GI tract during the procedure and reduce the bloating. If you had a lower endoscopy (such as a colonoscopy or flexible sigmoidoscopy) you may notice spotting of blood in your stool or on the toilet paper. If you underwent a bowel prep for your procedure, you may not have a normal bowel movement for a few days.  Please Note:  You might notice some irritation and congestion in your nose or some drainage.  This is from the oxygen used during your procedure.  There is no need for concern and it should clear up in a day or so.  SYMPTOMS TO REPORT IMMEDIATELY:   Following lower endoscopy (colonoscopy or flexible sigmoidoscopy):  Excessive amounts of blood in the stool  Significant tenderness or worsening of abdominal pains  Swelling of the abdomen that is new, acute  Fever of 100F or higher  For urgent or emergent issues, a gastroenterologist can be reached at any hour by calling [phone]. Do not use MyChart messaging for urgent concerns.    DIET:  We do recommend a small meal at first, but then you may proceed to your regular diet.  Drink plenty of fluids but you should avoid alcoholic  beverages for 24 hours.  ACTIVITY:  You should plan to take it easy for the rest of today and you should NOT DRIVE or use heavy machinery until tomorrow (because of the sedation medicines used during the test).    FOLLOW UP: Our staff will call the number listed on your records 48-72 hours following your procedure to check on you and address any questions or concerns that you may have regarding the information given to you following your procedure. If we do not reach you, we will leave a message.  We will attempt to reach you two times.  During this call, we will ask if you have developed any symptoms of COVID 19. If you develop any symptoms (ie: fever, flu-like symptoms, shortness of breath, cough etc.) before then, please call [phone].  If you test positive for Covid 19 in the 2 weeks post procedure, please call and report this information to [place].    If any biopsies were taken you will be contacted by phone or by letter within the next 1-3 weeks.  Please call us at [phone] if you have not heard about the biopsies in 3 weeks.    SIGNATURES/CONFIDENTIALITY: You and/or your care partner have signed paperwork which will be entered into your electronic medical record.  These signatures attest to the fact that that the information above on your After Visit Summary has been reviewed and is understood.  Full responsibility of the  confidentiality of this discharge information lies with you and/or your care-partner.

## 2020-06-02 NOTE — Progress Notes (Signed)
Pt's states no medical or surgical changes since previsit or office visit.  CHECK-IN-JB  Vital Signs-Wilber

## 2020-06-02 NOTE — Op Note (Signed)
[facility] Patient Name: Martin Norris Procedure Date: 06/02/2020 8:35 AM MRN: [id number] Endoscopist: Tressia DanasKimberly  MD, MD Age: 70 Referring MD:  Date of Birth: 1950-10-14 Gender: Male Account #: [id number] Procedure:                Colonoscopy Indications:              Surveillance: Personal history of adenomatous                            polyps on last colonoscopy > 5 years ago                           [facility]                            [facility], Surveillance recommended in 5 years                           No known family history of colon cancer or polyps Medicines:                Monitored Anesthesia Care Procedure:                Pre-Anesthesia Assessment:                           - Prior to the procedure, a History and Physical                            was performed, and patient medications and                            allergies were reviewed. The patient's tolerance of                            previous anesthesia was also reviewed. The risks                            and benefits of the procedure and the sedation                            options and risks were discussed with the patient.                            All questions were answered, and informed consent                            was obtained. Prior Anticoagulants: The patient has                            taken no previous anticoagulant or antiplatelet                            agents. ASA Grade Assessment: II - A patient with  mild systemic disease. After reviewing the risks                            and benefits, the patient was deemed in                            satisfactory condition to undergo the procedure.                           After obtaining informed consent, the colonoscope                            was passed under direct vision. Throughout the                            procedure, the patient's blood  pressure, pulse, and                            oxygen saturations were monitored continuously. The                            Olympus CF-HQ190L ([id number]) Colonoscope was                            introduced through the anus and advanced to the 3                            cm into the ileum. A second forward view of the                            right colon was performed. The colonoscopy was                            performed without difficulty. The patient tolerated                            the procedure well. The quality of the bowel                            preparation was good. The terminal ileum, ileocecal                            valve, appendiceal orifice, and rectum were                            photographed. Scope In: 8:48:51 AM Scope Out: 8:59:13 AM Scope Withdrawal Time: 0 hours 9 minutes 3 seconds  Total Procedure Duration: 0 hours 10 minutes 22 seconds  Findings:                 The perianal and digital rectal examinations were                            normal.  Multiple small and large-mouthed diverticula were                            found in the sigmoid colon and descending colon.                           The exam was otherwise without abnormality on                            direct and retroflexion views. Complications:            No immediate complications. Estimated Blood Loss:     Estimated blood loss: none. Impression:               - Diverticulosis in the sigmoid colon and in the                            descending colon.                           - The examination was otherwise normal on direct                            and retroflexion views.                           - No specimens collected. Recommendation:           - Patient has a contact number available for                            emergencies. The signs and symptoms of potential                            delayed complications were discussed with the                             patient. Return to normal activities tomorrow.                            Written discharge instructions were provided to the                            patient.                           - Follow a high fiber diet. Drink at least 64                            ounces of water daily. Add a daily stool bulking                            agent such as psyllium (an exampled would be                            Metamucil).                           -  Continue present medications.                           - Repeat colonoscopy in 7-10 years for surveillance.                           - Emerging evidence supports eating a diet of                            fruits, vegetables, grains, calcium, and yogurt                            while reducing red meat and alcohol may reduce the                            risk of colon cancer.                           - Thank you for allowing me to be involved in your                            colon cancer prevention. Tressia Danas MD, MD 06/02/2020 9:04:42 AM This report has been signed electronically.

## 2020-06-06 ENCOUNTER — Telehealth: Payer: Self-pay | Admitting: *Deleted

## 2020-06-06 NOTE — Telephone Encounter (Signed)
  Follow up Call-  Call back number 06/02/2020  Post procedure Call Back phone  # [phone]  Permission to leave phone message Yes  Some recent data might be hidden     First attempt for follow up phone call. No answer at number given.  Left message on voicemail.

## 2020-06-06 NOTE — Telephone Encounter (Signed)
No answer for post procedure call back. Left message for patient to call with questions or concerns. 

## 2020-06-15 ENCOUNTER — Telehealth (INDEPENDENT_AMBULATORY_CARE_PROVIDER_SITE_OTHER): Payer: Self-pay | Admitting: Physician Assistant

## 2020-06-15 NOTE — Telephone Encounter (Signed)
lvm 06/05/20 for patient to callback to be scheduled  Called patient 06/15/20 to schedule lo. [employer]. [employer].   Patient wanted asked for CPT code so that he can call his insurance company to see what they will cover and patient will callback to schedule.

## 2020-10-06 ENCOUNTER — Encounter: Payer: Self-pay | Admitting: Physician Assistant

## 2020-10-06 ENCOUNTER — Other Ambulatory Visit: Payer: Self-pay

## 2020-10-06 ENCOUNTER — Ambulatory Visit (INDEPENDENT_AMBULATORY_CARE_PROVIDER_SITE_OTHER): Payer: Medicare PPO | Admitting: Physician Assistant

## 2020-10-06 VITALS — BP 132/77 | HR 59 | Temp 98.6°F | Ht 68.0 in | Wt 241.5 lb

## 2020-10-06 DIAGNOSIS — E782 Mixed hyperlipidemia: Secondary | ICD-10-CM

## 2020-10-06 DIAGNOSIS — I2510 Atherosclerotic heart disease of native coronary artery without angina pectoris: Secondary | ICD-10-CM | POA: Diagnosis not present

## 2020-10-06 DIAGNOSIS — Z23 Encounter for immunization: Secondary | ICD-10-CM | POA: Diagnosis not present

## 2020-10-06 NOTE — Patient Instructions (Signed)
Heart-Healthy Eating Plan Heart-healthy meal planning includes: Eating less unhealthy fats. Eating more healthy fats. Making other changes in your diet. Talk with your doctor or a diet specialist (dietitian) to create an eating plan that is right for you. What is my plan? Your doctor may recommend an eating plan that includes: Total fat: ______% or less of total calories a day. Saturated fat: ______% or less of total calories a day. Cholesterol: less than _________mg a day. What are tips for following this plan? Cooking Avoid frying your food. Try to bake, boil, grill, or broil it instead. You can also reduce fat by: Removing the skin from poultry. Removing all visible fats from meats. Steaming vegetables in water or broth. Meal planning  At meals, divide your plate into four equal parts: Fill one-half of your plate with vegetables and green salads. Fill one-fourth of your plate with whole grains. Fill one-fourth of your plate with lean protein foods. Eat 4-5 servings of vegetables per day. A serving of vegetables is: 1 cup of raw or cooked vegetables. 2 cups of raw leafy greens. Eat 4-5 servings of fruit per day. A serving of fruit is: 1 medium whole fruit.  cup of dried fruit.  cup of fresh, frozen, or canned fruit.  cup of 100% fruit juice. Eat more foods that have soluble fiber. These are apples, broccoli, carrots, beans, peas, and barley. Try to get 20-30 g of fiber per day. Eat 4-5 servings of nuts, legumes, and seeds per week: 1 serving of dried beans or legumes equals  cup after being cooked. 1 serving of nuts is  cup. 1 serving of seeds equals 1 tablespoon. General information Eat more home-cooked food. Eat less restaurant, buffet, and fast food. Limit or avoid alcohol. Limit foods that are high in starch and sugar. Avoid fried foods. Lose weight if you are overweight. Keep track of how much salt (sodium) you eat. This is important if you have high blood  pressure. Ask your doctor to tell you more about this. Try to add vegetarian meals each week. Fats Choose healthy fats. These include olive oil and canola oil, flaxseeds, walnuts, almonds, and seeds. Eat more omega-3 fats. These include salmon, mackerel, sardines, tuna, flaxseed oil, and ground flaxseeds. Try to eat fish at least 2 times each week. Check food labels. Avoid foods with trans fats or high amounts of saturated fat. Limit saturated fats. These are often found in animal products, such as meats, butter, and cream. These are also found in plant foods, such as palm oil, palm kernel oil, and coconut oil. Avoid foods with partially hydrogenated oils in them. These have trans fats. Examples are stick margarine, some tub margarines, cookies, crackers, and other baked goods. What foods can I eat? Fruits All fresh, canned (in natural juice), or frozen fruits. Vegetables Fresh or frozen vegetables (raw, steamed, roasted, or grilled). Green salads. Grains Most grains. Choose whole wheat and whole grains most of the time. Rice and pasta, including brown rice and pastas made with whole wheat. Meats and other proteins Lean, well-trimmed beef, veal, pork, and lamb. Chicken and turkey without skin. All fish and shellfish. Wild duck, rabbit, pheasant, and venison. Egg whites or low-cholesterol egg substitutes. Dried beans, peas, lentils, and tofu. Seeds and most nuts. Dairy Low-fat or nonfat cheeses, including ricotta and mozzarella. Skim or 1% milk that is liquid, powdered, or evaporated. Buttermilk that is made with low-fat milk. Nonfat or low-fat yogurt. Fats and oils Non-hydrogenated (trans-free) margarines. Vegetable oils, including   soybean, sesame, sunflower, olive, peanut, safflower, corn, canola, and cottonseed. Salad dressings or mayonnaise made with a vegetable oil. Beverages Mineral water. Coffee and tea. Diet carbonated beverages. Sweets and desserts Sherbet, gelatin, and fruit ice.  Small amounts of dark chocolate. Limit all sweets and desserts. Seasonings and condiments All seasonings and condiments. The items listed above may not be a complete list of foods and drinks you can eat. Contact a dietitian for more options. What foods should I avoid? Fruits Canned fruit in heavy syrup. Fruit in cream or butter sauce. Fried fruit. Limit coconut. Vegetables Vegetables cooked in cheese, cream, or butter sauce. Fried vegetables. Grains Breads that are made with saturated or trans fats, oils, or whole milk. Croissants. Sweet rolls. Donuts. High-fat crackers, such as cheese crackers. Meats and other proteins Fatty meats, such as hot dogs, ribs, sausage, bacon, rib-eye roast or steak. High-fat deli meats, such as salami and bologna. Caviar. Domestic duck and goose. Organ meats, such as liver. Dairy Cream, sour cream, cream cheese, and creamed cottage cheese. Whole-milk cheeses. Whole or 2% milk that is liquid, evaporated, or condensed. Whole buttermilk. Cream sauce or high-fat cheese sauce. Yogurt that is made from whole milk. Fats and oils Meat fat, or shortening. Cocoa butter, hydrogenated oils, palm oil, coconut oil, palm kernel oil. Solid fats and shortenings, including bacon fat, salt pork, lard, and butter. Nondairy cream substitutes. Salad dressings with cheese or sour cream. Beverages Regular sodas and juice drinks with added sugar. Sweets and desserts Frosting. Pudding. Cookies. Cakes. Pies. Milk chocolate or white chocolate. Buttered syrups. Full-fat ice cream or ice cream drinks. The items listed above may not be a complete list of foods and drinks to avoid. Contact a dietitian for more information. Summary Heart-healthy meal planning includes eating less unhealthy fats, eating more healthy fats, and making other changes in your diet. Eat a balanced diet. This includes fruits and vegetables, low-fat or nonfat dairy, lean protein, nuts and legumes, whole grains, and  heart-healthy oils and fats. This information is not intended to replace advice given to you by your health care provider. Make sure you discuss any questions you have with your health care provider. Document Revised: 05/25/2020 Document Reviewed: 05/25/2020 Elsevier Patient Education  2022 Elsevier Inc.  

## 2020-10-06 NOTE — Assessment & Plan Note (Signed)
Followed by Cardiology 

## 2020-10-06 NOTE — Assessment & Plan Note (Signed)
-  Controlled. -Continue current medication regimen. -Advised to reduce fat and simple carbohydrate intake. -Will continue to monitor alongside cardiology.

## 2020-10-06 NOTE — Progress Notes (Signed)
Established Patient Office Visit  Subjective:  Patient ID: Martin Norris, male    DOB: 1950/02/10  Age: 70 y.o. MRN: [id number]  CC:  Chief Complaint  Patient presents with   Follow-up   Hyperlipidemia    HPI Martin Norris presents for follow up hyperlipidemia. Pt taking medication as directed without issues. Denies side effects including myalgias, muscle weakness and RUQ pain. Does report eating more sugars and fried foods than normal which has been triggering an upset stomach due to his hiatal hernia but has started metamucil. 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    Past Medical History:  Diagnosis Date   Arthritis    Phreesia 04/02/2020   Blockage of coronary artery of heart (HCC)    Chronic kidney disease    KIDNEY STONES   Dizziness    Hyperlipidemia    Migraines    Vertigo     Past Surgical History:  Procedure Laterality Date   KNEE ARTHROSCOPY Left    LASIK Bilateral 2000   LEFT HEART CATH AND CORONARY ANGIOGRAPHY N/A 11/23/2019   Procedure: LEFT HEART CATH AND CORONARY ANGIOGRAPHY;  Surgeon: Lyn Records, MD;  Location: [facility];  Service: Cardiovascular;  Laterality: N/A;    Family History  Problem Relation Age of Onset   Healthy Mother    Macular degeneration Mother    Heart disease Father    Stroke Father    Hyperlipidemia Father    COPD Sister    Hypertension Brother    Hyperlipidemia Brother    Hypertension Sister    Hyperlipidemia Brother    Parkinson's disease Paternal Grandfather    Stroke Paternal Grandfather    Heart failure Paternal Grandfather    Colon cancer Neg Hx    Esophageal cancer Neg Hx    Rectal cancer Neg Hx    Stomach cancer Neg Hx     Social History   Socioeconomic History   Marital status: Married    Spouse name: [name]   Number of children: Not on file   Years of education: Not on file   Highest education level: 10th grade  Occupational History    Comment: retired Materials engineer maintenance   Tobacco Use   Smoking status: Former    Packs/day: 2.00    Years: 30.00    Pack years: 60.00    Types: Cigarettes    Quit date: 01/28/1989    Years since quitting: 31.7   Smokeless tobacco: Never  Vaping Use   Vaping Use: Never used  Substance and Sexual Activity   Alcohol use: Yes    Alcohol/week: 2.0 standard drinks    Types: 2 Cans of beer per week    Comment: weekends   Drug use: Not Currently    Types: Marijuana   Sexual activity: Yes    Birth control/protection: None  Other Topics Concern   Not on file  Social History Narrative   Lives with wife   Caffeine- coffee 1 c, Coke 1 daily, tea 2-3 daily   Social Determinants of Health   Financial Resource Strain: Not on file  Food Insecurity: Not on file  Transportation Needs: Not on file  Physical Activity: Not on file  Stress: Not on file  Social Connections: Not on file  Intimate Partner Violence: Not on file    Outpatient Medications Prior to Visit  Medication Sig Dispense Refill   aspirin EC 81 MG tablet Take 1 tablet (81 mg total) by mouth daily. Swallow  whole.     Ibuprofen (ADVIL PO) Take 1 tablet by mouth 3 (three) times daily as needed (pain). Duo     METAMUCIL FIBER PO Take 1 Scoop by mouth daily.     rosuvastatin (CRESTOR) 10 MG tablet Take 1 tablet (10 mg total) by mouth daily. 90 tablet 3   famotidine (PEPCID) 10 MG tablet Take 10 mg by mouth daily as needed for heartburn or indigestion. (Patient not taking: Reported on 10/06/2020)     No facility-administered medications prior to visit.    Allergies  Allergen Reactions   Codeine     Anxious,intolerence,nervous,mainly cold medicine   Statins Other (See Comments)    Atorvastatin causes MYALGIAS    ROS Review of Systems A fourteen system review of systems was performed and found to be positive as per HPI.   Objective:    Physical Exam General:  Well Developed, well nourished, appropriate for stated age.  Neuro:  Alert and oriented,   extra-ocular muscles intact  HEENT:  Normocephalic, atraumatic, neck supple  Skin:  no gross rash, warm, pink. Cardiac:  RRR, S1 S2 Respiratory:  CTA B/L, Not using accessory muscles, speaking in full sentences- unlabored. Vascular:  Ext warm, no cyanosis apprec.; cap RF less 2 sec. Psych:  No HI/SI, judgement and insight good, Euthymic mood. Full Affect.  BP 132/77   Pulse (!) 59   Temp 98.6 F (37 C)   Ht 5\' 8"  (1.727 m)   Wt 241 lb 8 oz (109.5 kg)   SpO2 96%   BMI 36.72 kg/m  Wt Readings from Last 3 Encounters:  10/06/20 241 lb 8 oz (109.5 kg)  06/02/20 234 lb 6.4 oz (106.3 kg)  05/10/20 234 lb 6.4 oz (106.3 kg)     Health Maintenance Due  Topic Date Due   Zoster Vaccines- Shingrix (1 of 2) Never done   PNA vac Low Risk Adult (1 of 2 - PCV13) Never done   COVID-19 Vaccine (4 - Booster for Pfizer series) 02/29/2020   INFLUENZA VACCINE  08/28/2020    There are no preventive care reminders to display for this patient.  Lab Results  Component Value Date   TSH 1.930 10/23/2017   Lab Results  Component Value Date   WBC 9.1 11/17/2019   HGB 17.0 11/17/2019   HCT 49.4 11/17/2019   MCV 88 11/17/2019   PLT 276 11/17/2019   Lab Results  Component Value Date   NA 138 11/17/2019   K 4.4 11/17/2019   CO2 25 11/17/2019   GLUCOSE 96 11/17/2019   BUN 14 11/17/2019   CREATININE 0.94 11/17/2019   BILITOT 0.6 01/13/2020   ALKPHOS 61 01/13/2020   AST 24 01/13/2020   ALT 28 01/13/2020   PROT 6.1 01/13/2020   ALBUMIN 4.3 01/13/2020   CALCIUM 9.8 11/17/2019   Lab Results  Component Value Date   CHOL 130 04/13/2020   Lab Results  Component Value Date   HDL 41 04/13/2020   Lab Results  Component Value Date   LDLCALC 66 04/13/2020   Lab Results  Component Value Date   TRIG 129 04/13/2020   Lab Results  Component Value Date   CHOLHDL 3.2 04/13/2020   Lab Results  Component Value Date   HGBA1C 5.5 09/08/2019      Assessment & Plan:   Problem List Items  Addressed This Visit       Cardiovascular and Mediastinum   CAD (coronary artery disease), native coronary artery    -Followed  by Cardiology.        Other   Hyperlipidemia - Primary    -Controlled. -Continue current medication regimen. -Advised to reduce fat and simple carbohydrate intake. -Will continue to monitor alongside cardiology.      Other Visit Diagnoses     Need for influenza vaccination       Relevant Orders   Flu Vaccine QUAD High Dose(Fluad)       No orders of the defined types were placed in this encounter.   Follow-up: Return in about 6 months (around 04/05/2021) for [facility] and [facility] few days prior .    Mayer Masker, PA-C

## 2020-10-27 ENCOUNTER — Encounter: Payer: Self-pay | Admitting: Cardiology

## 2020-10-27 ENCOUNTER — Ambulatory Visit: Payer: Medicare PPO | Admitting: Cardiology

## 2020-10-27 ENCOUNTER — Other Ambulatory Visit: Payer: Self-pay

## 2020-10-27 VITALS — BP 130/80 | HR 58 | Ht 68.0 in | Wt 239.0 lb

## 2020-10-27 DIAGNOSIS — E7800 Pure hypercholesterolemia, unspecified: Secondary | ICD-10-CM

## 2020-10-27 DIAGNOSIS — I209 Angina pectoris, unspecified: Secondary | ICD-10-CM | POA: Diagnosis not present

## 2020-10-27 DIAGNOSIS — I2510 Atherosclerotic heart disease of native coronary artery without angina pectoris: Secondary | ICD-10-CM | POA: Diagnosis not present

## 2020-10-27 NOTE — Progress Notes (Signed)
Cardiology Office Note:    Date:  10/27/2020   ID:  Martin Norris, DOB January 14, 1951, MRN [id number]  PCP:  Martin Norris   [facility]  Cardiologist:  Martin Schultz, MD  Advanced Practice Provider:  No care team member to display Electrophysiologist:  None       Referring MD: Martin Masker, PA-C     History of Present Illness:    Martin Norris is a 70 y.o. male here for the follow-up of coronary artery disease and hyperlipidemia.  Prior CT of his coronary arteries showed abnormal FFR.  He was experiencing substernal chest discomfort which he had been attributing to his hiatal hernia. Mid LAD flow-limiting disease at diagonal 2 was noted on FFR analysis, also mid circumflex was occluded and RCA appeared patent.   His cardiac catheterization as described below showed the large second obtuse marginal that is functionally occluded receiving right to left collaterals.  He also demonstrated mid stenosis of up to 75% as well as 80 to 90% first diagonal.  EF was 50%.   It was thought that if medical management did not work, he could consider CTO.  Dr. Eldridge Norris and I discussed.  His son Martin Norris who works at [employer] at [employer], had 4 stents placed.  Father had bypass as well as stroke.  Previously his LDL was 147 not able to take statins.  Now however, he is tolerating Crestor 10 mg without any myalgias.    Today, Overall he appears well with no new complaints. He reports continued intermittent pain due to his hernia.  He remains compliant with his medication.  He denies any palpitations, chest pain, or shortness of breath. No lightheadedness, headaches, syncope, orthopnea, or PND. Also has no lower extremity edema or exertional symptoms.    Past Medical History:  Diagnosis Date   Arthritis    Phreesia 04/02/2020   Blockage of coronary artery of heart (HCC)    Chronic kidney disease    KIDNEY STONES   Dizziness    Hyperlipidemia     Migraines    Vertigo     Past Surgical History:  Procedure Laterality Date   KNEE ARTHROSCOPY Left    LASIK Bilateral 2000   LEFT HEART CATH AND CORONARY ANGIOGRAPHY N/A 11/23/2019   Procedure: LEFT HEART CATH AND CORONARY ANGIOGRAPHY;  Surgeon: Lyn Records, MD;  Location: [facility];  Service: Cardiovascular;  Laterality: N/A;    Current Medications: Current Meds  Medication Sig   aspirin EC 81 MG tablet Take 1 tablet (81 mg total) by mouth daily. Swallow whole.   famotidine (PEPCID) 10 MG tablet Take 10 mg by mouth daily as needed for heartburn or indigestion.   Ibuprofen (ADVIL PO) Take 1 tablet by mouth 3 (three) times daily as needed (pain). Duo   METAMUCIL FIBER PO Take 1 Scoop by mouth daily.   rosuvastatin (CRESTOR) 10 MG tablet Take 1 tablet (10 mg total) by mouth daily.     Allergies:   Codeine and Statins   Social History   Socioeconomic History   Marital status: Married    Spouse name: Martin Norris   Number of children: Not on file   Years of education: Not on file   Highest education level: 10th grade  Occupational History    Comment: retired Materials engineer maintenance  Tobacco Use   Smoking status: Former    Packs/day: 2.00    Years: 30.00    Pack years: 60.00  Types: Cigarettes    Quit date: 01/28/1989    Years since quitting: 31.7   Smokeless tobacco: Never  Vaping Use   Vaping Use: Never used  Substance and Sexual Activity   Alcohol use: Yes    Alcohol/week: 2.0 standard drinks    Types: 2 Cans of beer per week    Comment: weekends   Drug use: Not Currently    Types: Marijuana   Sexual activity: Yes    Birth control/protection: None  Other Topics Concern   Not on file  Social History Narrative   Lives with wife   Caffeine- coffee 1 c, Coke 1 daily, tea 2-3 daily   Social Determinants of Health   Financial Resource Strain: Not on file  Food Insecurity: Not on file  Transportation Needs: Not on file  Physical Activity: Not on file  Stress:  Not on file  Social Connections: Not on file     Family History: The patient's family history includes COPD in his sister; Healthy in his mother; Heart disease in his father; Heart failure in his paternal grandfather; Hyperlipidemia in his brother, brother, and father; Hypertension in his brother and sister; Macular degeneration in his mother; Parkinson's disease in his paternal grandfather; Stroke in his father and paternal grandfather. There is no history of Colon cancer, Esophageal cancer, Rectal cancer, or Stomach cancer.  ROS:   Please see the history of present illness.    All other systems reviewed and are negative.  EKGs/Labs/Other Studies Reviewed:    The following studies were reviewed today:  EKG:      EKG is personally reviewed and interpreted. 10/27/2020: Sinus rhythm. Rate 58 bpm. PVCs.   Cardiac cath 11/23/19 Patent left main Moderately severe proximal and mid LAD disease.  Proximal and mid stenoses are 60 and 75% respectively.  80 to 90% first diagonal, and large second diagonal contains 50% ostial narrowing forming a Medina 111 bifurcation stenosis with the mid LAD stenosis.  Diagonal and continuation of LAD are equal in size. Large second obtuse marginal is functionally occluded and receives right to left collaterals from the PDA.  Collaterals appear to be well formed suggesting this is been present for quite some time. Dominant right coronary.  Large branching PDA.  Both limbs beyond the branch point contain 40 to 50% narrowing.  The PDA gives well-formed collaterals to the obtuse marginal Low normal LV function.  EF 50%.  LVEDP is normal.   RECOMMENDATIONS:   Aggressive risk factor modification including PCSK9 therapy. Medical management of current coronary anatomy and if he becomes symptomatic consider CABG to include the diagonals LAD and obtuse marginal versus higher risk stent of the LAD (due to jailing of large diagonals). Dominance: Right     CT Coronary FFR  11/11/2019: FINDINGS: FFRct analysis was performed on the original cardiac CT angiogram dataset. Diagrammatic representation of the FFRct analysis is provided in a separate PDF document in PACS. This dictation was created using the PDF document and an interactive 3D model of the results. 3D model is not available in the EMR/PACS. Normal FFR range is >0.80.   1. Left Main: No functionally significant stenosis   2. LAD: Functionally significant stenosis in the mid LAD, FFR 0.56 with FFR 0.55 in the D2 vessel   3. LCX: Modeled as a total occlusion of the mid LCx   4. RCA: Distal, functional stenosis in the PDA.  FFR 0.76   IMPRESSION: Evidence of multi-vessel, functionally significant coronary artery disease.   Note:  These examples are not recommendations of HeartFlow and only provided as examples of what other customers are doing.  Cardiac CTA 11/11/2019:  FINDINGS: A 100 kV prospective scan was triggered in the descending thoracic aorta at 111 HU's. Axial non-contrast 3 mm slices were carried out through the heart. The data set was analyzed on a dedicated work station and scored using the Agatson method. Gantry rotation speed was 250 msecs and collimation was .6 mm. No beta blockade and 0.8 mg of sl NTG was given. The 3D data set was reconstructed in 5% intervals of the 67-82 % of the R-R cycle. Diastolic phases were analyzed on a dedicated work station using MPR, MIP and VRT modes. The patient received 80 cc of contrast.   Aorta: Normal size. Aortic calcifications at the annulus. No dissection.   Aortic Valve:  Trileaflet.  No calcifications.   Coronary Arteries:  Normal coronary origin.  Right dominance.   RCA is a large dominant artery that gives rise to PDA and PLA. There is a proximal RCA calcific lesion at least 50% (50-70%), there is a mRCA 25-49% calcific lesion.   Left main is a large artery that gives rise to LAD and LCX arteries. There is a distal 1-24%  calcific lesion.   LAD is a non-dominant artery that has a proximal 0-24% calcific lesion, a mid 0-24 calcific lesion, and a distal 25-49% calcific lesion.   LCX is a non-dominant artery that gives rise to one large OM1 branch. There is a 25-49% soft plaque in the mid circumflex.   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 Otherwise normal pulmonary vein drainage into the left atrium.   Normal left atrial appendage without a thrombus.   Normal size of the pulmonary artery.   IMPRESSION: 1. Coronary calcium score of 503. This was 60 percentile for age and sex matched control.   2. Normal coronary origin with right dominance.   3. CAD-RADS 3. Moderate stenosis. Consider symptom-guided anti-ischemic pharmacotherapy as well as risk factor modification per guideline directed care. Additional analysis with CT FFR will be submitted.  Recent Labs: 11/17/2019: BUN 14; Creatinine, Ser 0.94; Hemoglobin 17.0; Platelets 276; Potassium 4.4; Sodium 138 01/13/2020: ALT 28   Recent Lipid Panel    Component Value Date/Time   CHOL 130 04/13/2020 1526   TRIG 129 04/13/2020 1526   HDL 41 04/13/2020 1526   CHOLHDL 3.2 04/13/2020 1526   LDLCALC 66 04/13/2020 1526     Risk Assessment/Calculations:      Physical Exam:    VS:  BP 130/80 (BP Location: Left Arm, Patient Position: Sitting, Cuff Size: Normal)   Pulse (!) 58   Ht 5\' 8"  (1.727 m)   Wt 239 lb (108.4 kg)   SpO2 97%   BMI 36.34 kg/m     Wt Readings from Last 3 Encounters:  10/27/20 239 lb (108.4 kg)  10/06/20 241 lb 8 oz (109.5 kg)  06/02/20 234 lb 6.4 oz (106.3 kg)     GEN: Well nourished, well developed in no acute distress HEENT: Normal NECK: No JVD; No carotid bruits LYMPHATICS: No lymphadenopathy CARDIAC: RRR, no murmurs, rubs, gallops RESPIRATORY:  Clear to auscultation without rales, wheezing or rhonchi  ABDOMEN: Soft, non-tender,  non-distended MUSCULOSKELETAL:  No edema; No deformity  SKIN: Warm and dry NEUROLOGIC:  Alert and oriented x 3 PSYCHIATRIC:  Normal affect     ASSESSMENT:    1.  Coronary artery disease involving native coronary artery of native heart without angina pectoris   2. Angina pectoris (HCC)   3. Pure hypercholesterolemia     PLAN:    In order of problems listed above: CAD (coronary artery disease), native coronary artery Cardiac catheterization reviewed.  There is a CTO present.  Since he is not having any significant symptoms, we will continue to manage medically.  If symptoms do arise, we can always asked Dr. Eldridge Norris to attempt CTO. -Continue with aspirin, statin, nitroglycerin as needed.  Angina pectoris (HCC) Currently well controlled on medications.  Continue with diet, exercise.  Hyperlipidemia Has had trouble in the past with higher doses of Crestor.  Currently, doing well.  Continue with Crestor 10 mg once a day.  No changes made.  No myalgias.  Last LDL 66 from outside labs and March 2022.  Creatinine 0.94 potassium 4.4 ALT 28 TSH 1.9.  Excellent.  Coronary artery disease -Discussed the possibility of CTO opening with Dr. Eldridge Norris.  May be favorable.  Currently not having any anginal symptoms.  Holding off on any further procedures at this time.  Continue with aspirin statin.  Has nitroglycerin as needed.  Hyperlipidemia with prior statin intolerance -Taking Crestor 10 mg daily without any significant myalgias.  Appreciate lipid clinic assistance.  Last LDL 78 in December 2021 (while on Crestor 5 mg a day).  Hemoglobin A1c 5.5 creatinine 0.9 -We will go ahead and check fasting lipid panel today.  Follow-up: 1 year.  Medication Adjustments/Labs and Tests Ordered: Current medicines are reviewed at length with the patient today.  Concerns regarding medicines are outlined above.   Orders Placed This Encounter  Procedures   EKG 12-Lead    No orders of the defined types  were placed in this encounter.  Patient Instructions  Medication Instructions:  The current medical regimen is effective;  continue present plan and medications.  *If you need a refill on your cardiac medications before your next appointment, please call your pharmacy*  Follow-Up: At [facility], you and your health needs are our priority.  As part of our continuing mission to provide you with exceptional heart care, we have created designated Provider Care Teams.  These Care Teams include your primary Cardiologist (physician) and Advanced Practice Providers (APPs -  Physician Assistants and Nurse Practitioners) who all work together to provide you with the care you need, when you need it.  We recommend signing up for the patient portal called "MyChart".  Sign up information is provided on this After Visit Summary.  MyChart is used to connect with patients for Virtual Visits (Telemedicine).  Patients are able to view lab/test results, encounter notes, upcoming appointments, etc.  Non-urgent messages can be sent to your provider as well.   To learn more about what you can do with MyChart, go to [email or web address].    Your next appointment:   1 year(s)  The format for your next appointment:   In Person  Provider:   Donato Schultz, MD  Thank you for choosing [facility]!!      I,Mathew Stumpf,acting as a scribe for Martin Schultz, MD.,have documented all relevant documentation on the behalf of Martin Schultz, MD,as directed by  Martin Schultz, MD while in the presence of Martin Schultz, MD.  I, Martin Schultz, MD, have reviewed all documentation for this visit. The documentation on 10/27/20 for the exam, diagnosis, procedures, and orders are all accurate and complete.   Signed, Martin Schultz, MD  10/27/2020 1:17 PM  [facility]

## 2020-10-27 NOTE — Assessment & Plan Note (Signed)
Cardiac catheterization reviewed.  There is a CTO present.  Since he is not having any significant symptoms, we will continue to manage medically.  If symptoms do arise, we can always asked Dr. Eldridge Dace to attempt CTO. -Continue with aspirin, statin, nitroglycerin as needed.

## 2020-10-27 NOTE — Assessment & Plan Note (Signed)
Has had trouble in the past with higher doses of Crestor.  Currently, doing well.  Continue with Crestor 10 mg once a day.  No changes made.  No myalgias.  Last LDL 66 from outside labs and March 2022.  Creatinine 0.94 potassium 4.4 ALT 28 TSH 1.9.  Excellent.

## 2020-10-27 NOTE — Assessment & Plan Note (Signed)
Currently well controlled on medications.  Continue with diet, exercise.

## 2020-10-27 NOTE — Patient Instructions (Signed)
Medication Instructions:  The current medical regimen is effective;  continue present plan and medications.  *If you need a refill on your cardiac medications before your next appointment, please call your pharmacy*  Follow-Up: At [facility], you and your health needs are our priority.  As part of our continuing mission to provide you with exceptional heart care, we have created designated Provider Care Teams.  These Care Teams include your primary Cardiologist (physician) and Advanced Practice Providers (APPs -  Physician Assistants and Nurse Practitioners) who all work together to provide you with the care you need, when you need it.  We recommend signing up for the patient portal called "MyChart".  Sign up information is provided on this After Visit Summary.  MyChart is used to connect with patients for Virtual Visits (Telemedicine).  Patients are able to view lab/test results, encounter notes, upcoming appointments, etc.  Non-urgent messages can be sent to your provider as well.   To learn more about what you can do with MyChart, go to [email or web address].    Your next appointment:   1 year(s)  The format for your next appointment:   In Person  Provider:   Mark Skains, MD   Thank you for choosing [facility]!!    

## 2020-11-22 DIAGNOSIS — M170 Bilateral primary osteoarthritis of knee: Secondary | ICD-10-CM | POA: Diagnosis not present

## 2020-12-11 DIAGNOSIS — H43811 Vitreous degeneration, right eye: Secondary | ICD-10-CM | POA: Diagnosis not present

## 2020-12-11 DIAGNOSIS — H2513 Age-related nuclear cataract, bilateral: Secondary | ICD-10-CM | POA: Diagnosis not present

## 2020-12-11 DIAGNOSIS — H5319 Other subjective visual disturbances: Secondary | ICD-10-CM | POA: Diagnosis not present

## 2020-12-14 ENCOUNTER — Ambulatory Visit: Payer: Medicare PPO | Attending: Internal Medicine

## 2020-12-14 DIAGNOSIS — Z23 Encounter for immunization: Secondary | ICD-10-CM

## 2020-12-14 NOTE — Progress Notes (Signed)
   Covid-19 Vaccination Clinic  Name:  TRICE ASPINALL    MRN: [id number] DOB: 11/10/1950  12/14/2020  Mr. Youtz was observed post Covid-19 immunization for 15 minutes without incident. He was provided with Vaccine Information Sheet and instruction to access the V-Safe system.   Mr. Eber was instructed to call 911 with any severe reactions post vaccine: Difficulty breathing  Swelling of face and throat  A fast heartbeat  A bad rash all over body  Dizziness and weakness   Immunizations Administered     Name Date Dose VIS Date Route   Pfizer Covid-19 Vaccine Bivalent Booster 12/14/2020 11:49 AM 0.3 mL 09/27/2020 Intramuscular   Manufacturer: [employer], [employer]   Lot: [id number]   NDC: [phone]

## 2020-12-30 ENCOUNTER — Other Ambulatory Visit (HOSPITAL_BASED_OUTPATIENT_CLINIC_OR_DEPARTMENT_OTHER): Payer: Self-pay

## 2020-12-30 MED ORDER — PFIZER COVID-19 VAC BIVALENT 30 MCG/0.3ML IM SUSP
INTRAMUSCULAR | 0 refills | Status: DC
  Filled 2020-12-30: qty 0.3, 1d supply, fill #0

## 2021-01-01 ENCOUNTER — Other Ambulatory Visit (HOSPITAL_BASED_OUTPATIENT_CLINIC_OR_DEPARTMENT_OTHER): Payer: Self-pay

## 2021-01-10 ENCOUNTER — Other Ambulatory Visit: Payer: Self-pay | Admitting: Cardiology

## 2021-02-26 IMAGING — CT CT HEART MORP W/ CTA COR W/ SCORE W/ CA W/CM &/OR W/O CM
1 series · 5 of 7 positions shown, 7 images · non-contrast
Comparison: None.
COMPARISON: None.

Addendum:
EXAM:
OVER-READ INTERPRETATION  CT CHEST

The following report is an over-read performed by radiologist Dr.
Yllnor Musteata [REDACTED] on 11/11/2019. This
over-read does not include interpretation of cardiac or coronary
anatomy or pathology. The coronary calcium score/coronary CTA
interpretation by the cardiologist is attached.
CLINICAL DATA: 68 year old white male
Cardiac/Coronary  CTA
TECHNIQUE: The patient was scanned on a Phillips Force scanner.

[Series 715: — · 5 of 7 slices shown, 7 images]
[im 2/7  vessel]
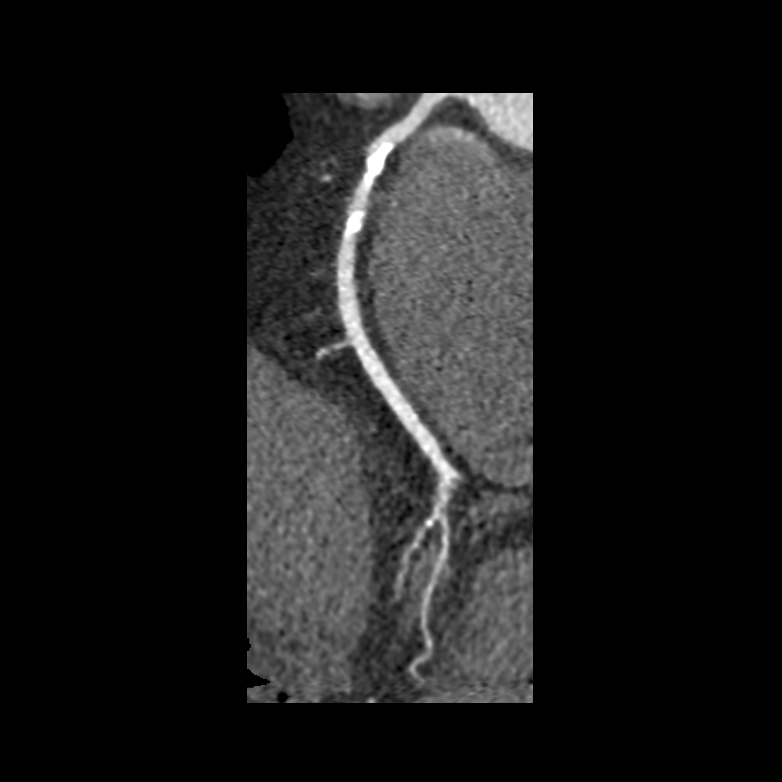
[im 2/7  lung]
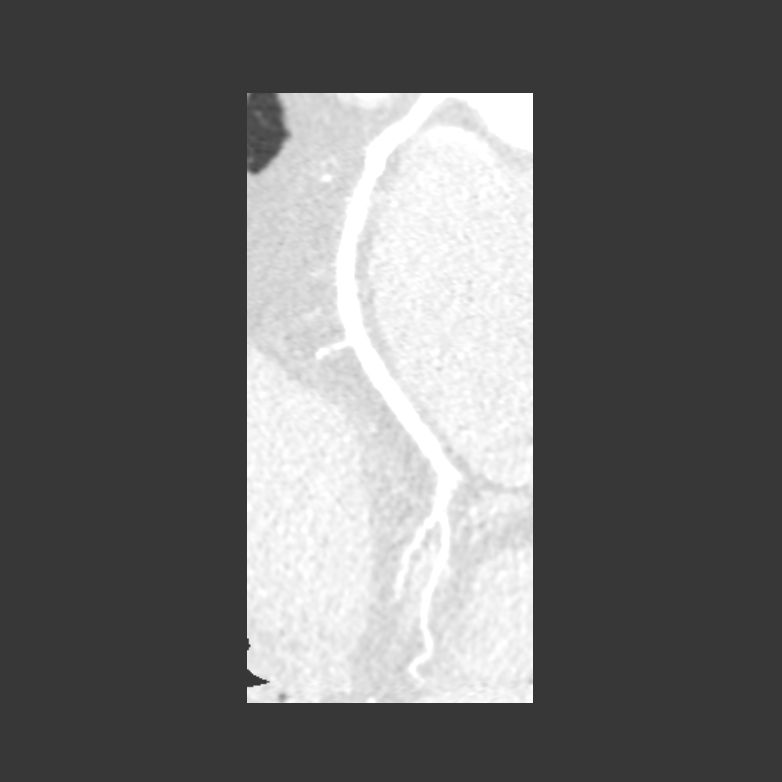
[im 3/7  vessel]
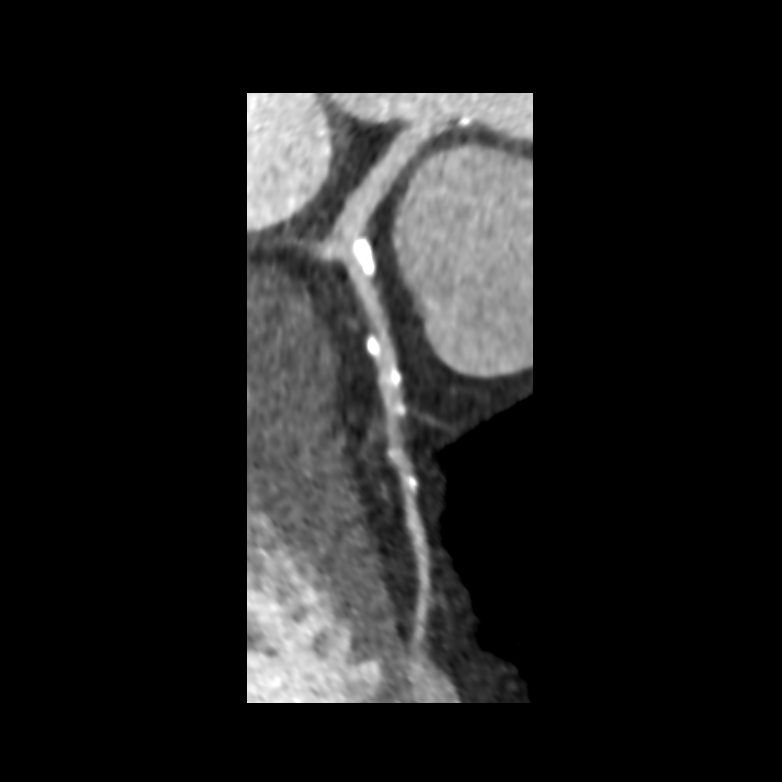
[im 4/7  vessel]
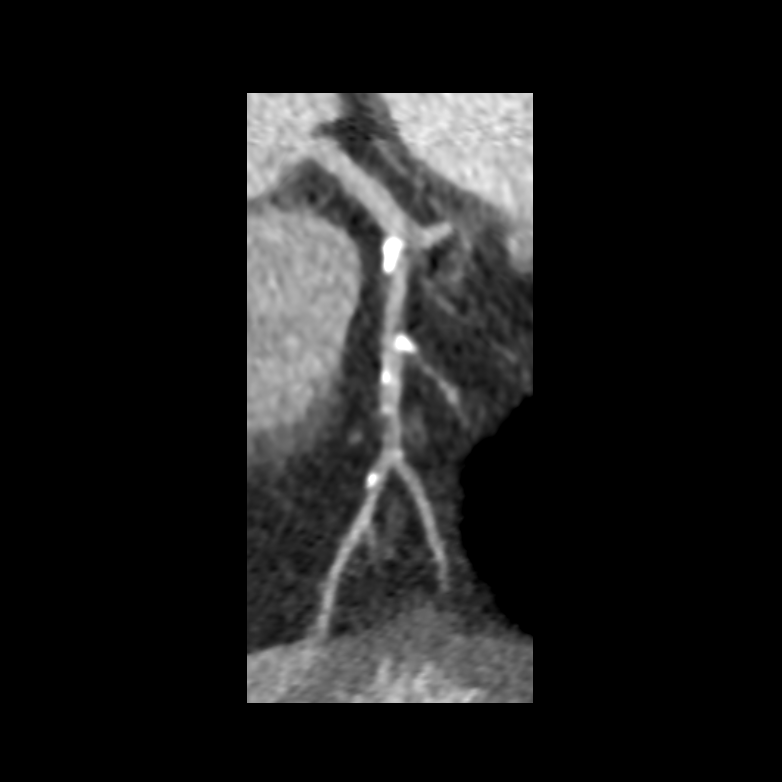
[im 5/7  vessel]
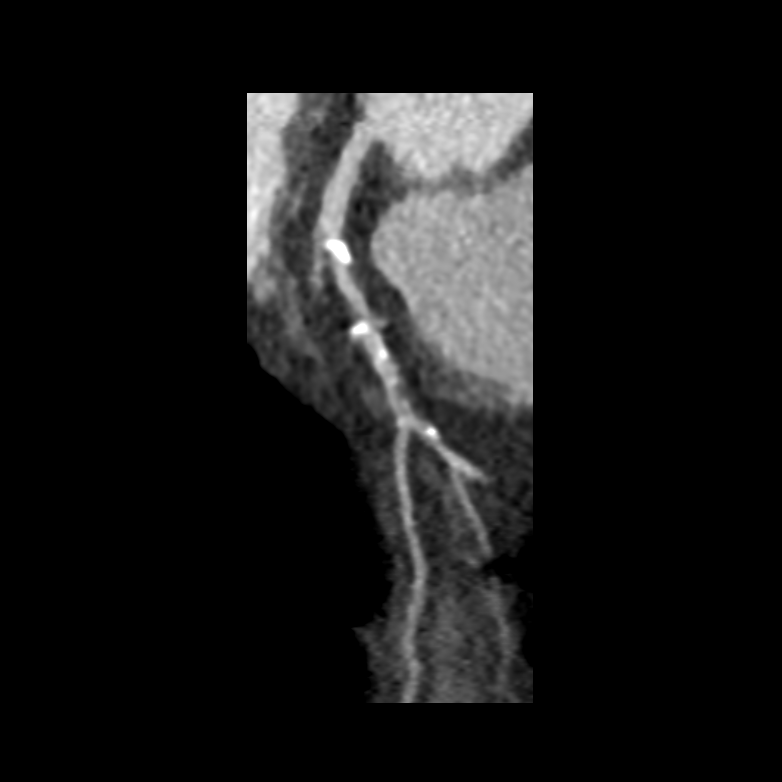
[im 6/7  vessel]
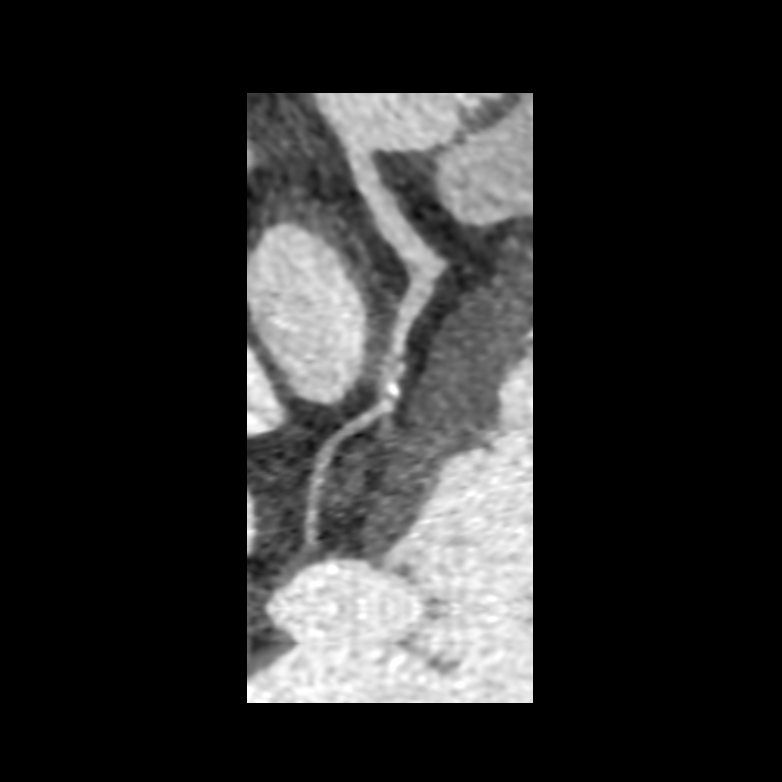
[im 6/7  lung]
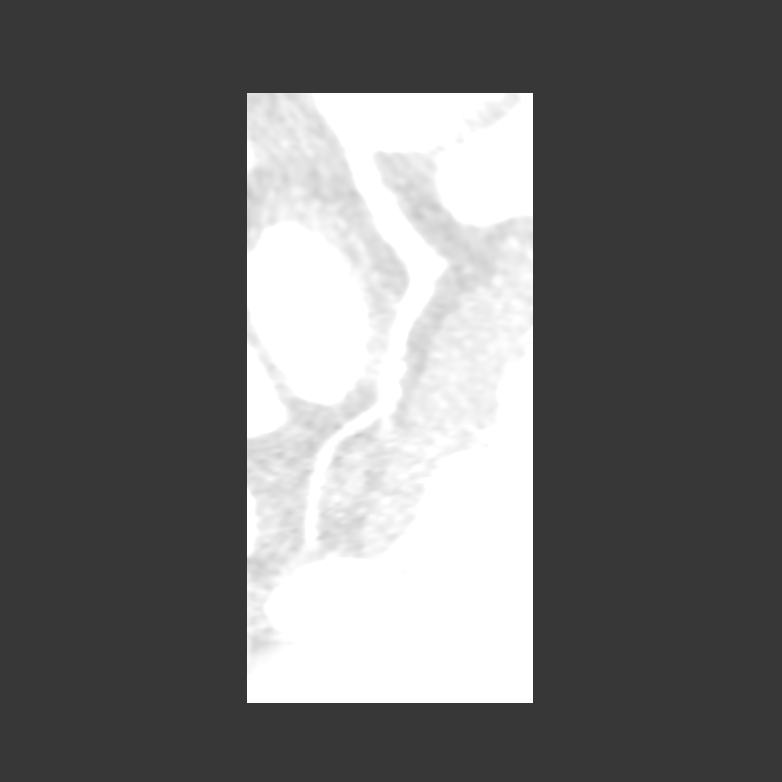

[5 of 7 positions shown; findings below may reference images not displayed]

FINDINGS: Elevation of the right hemidiaphragm. Within the visualized portions
of the thorax there are no suspicious appearing pulmonary nodules or
masses, there is no acute consolidative airspace disease, no pleural
effusions, no pneumothorax and no lymphadenopathy. Visualized
portions of the upper abdomen are unremarkable. There are no
aggressive appearing lytic or blastic lesions noted in the
visualized portions of the skeleton.
IMPRESSION: 1. Mild elevation of the right hemidiaphragm.
FINDINGS: A 100 kV prospective scan was triggered in the descending thoracic
aorta at 111 HU's. Axial non-contrast 3 mm slices were carried out
through the heart. The data set was analyzed on a dedicated work
station and scored using the Agatson method. Gantry rotation speed
was 250 msecs and collimation was .6 mm. No beta blockade and 0.8 mg
of sl NTG was given. The 3D data set was reconstructed in 5%
intervals of the 67-82 % of the R-R cycle. Diastolic phases were
analyzed on a dedicated work station using MPR, MIP and VRT modes.
The patient received 80 cc of contrast.

Aorta: Normal size. Aortic calcifications at the annulus. No
dissection.

Aortic Valve:  Trileaflet.  No calcifications.

Coronary Arteries:  Normal coronary origin.  Right dominance.

RCA is a large dominant artery that gives rise to PDA and PLA. There
mRCA 25-49% calcific lesion.

Left main is a large artery that gives rise to LAD and LCX arteries.
There is a distal 1-24% calcific lesion.

LAD is a non-dominant artery that has a proximal 0-24% calcific
lesion, a mid 0-24 calcific lesion, and a distal 25-49% calcific
lesion.

LCX is a non-dominant artery that gives rise to one large OM1
branch. There is a 25-49% soft plaque in the mid circumflex.

Other findings:

There is a right middle and right lower pulmonary vein that fuse
with a short central ostium. Otherwise normal pulmonary vein
drainage into the left atrium.

Normal left atrial appendage without a thrombus.

Normal size of the pulmonary artery.
IMPRESSION: 1. Coronary calcium score of 503. This was 81 percentile for age and
sex matched control.

2. Normal coronary origin with right dominance.

3. CAD-RADS 3. Moderate stenosis. Consider symptom-guided
anti-ischemic pharmacotherapy as well as risk factor modification
per guideline directed care. Additional analysis with CT FFR will be
submitted.

[employer]

*** End of Addendum ***
EXAM:
OVER-READ INTERPRETATION  CT CHEST

The following report is an over-read performed by radiologist Dr.
Yllnor Musteata [REDACTED] on 11/11/2019. This
over-read does not include interpretation of cardiac or coronary
anatomy or pathology. The coronary calcium score/coronary CTA
interpretation by the cardiologist is attached.
FINDINGS: Elevation of the right hemidiaphragm. Within the visualized portions
of the thorax there are no suspicious appearing pulmonary nodules or
masses, there is no acute consolidative airspace disease, no pleural
effusions, no pneumothorax and no lymphadenopathy. Visualized
portions of the upper abdomen are unremarkable. There are no
aggressive appearing lytic or blastic lesions noted in the
visualized portions of the skeleton.
IMPRESSION: 1. Mild elevation of the right hemidiaphragm.

## 2021-04-02 ENCOUNTER — Other Ambulatory Visit: Payer: Self-pay

## 2021-04-02 DIAGNOSIS — Z1321 Encounter for screening for nutritional disorder: Secondary | ICD-10-CM

## 2021-04-02 DIAGNOSIS — Z130 Encounter for screening for diseases of the blood and blood-forming organs and certain disorders involving the immune mechanism: Secondary | ICD-10-CM

## 2021-04-02 DIAGNOSIS — Z0000 Encounter for general adult medical examination without abnormal findings: Secondary | ICD-10-CM

## 2021-04-04 ENCOUNTER — Other Ambulatory Visit: Payer: Medicare PPO

## 2021-04-04 ENCOUNTER — Other Ambulatory Visit: Payer: Self-pay

## 2021-04-04 DIAGNOSIS — Z0000 Encounter for general adult medical examination without abnormal findings: Secondary | ICD-10-CM | POA: Diagnosis not present

## 2021-04-04 DIAGNOSIS — Z130 Encounter for screening for diseases of the blood and blood-forming organs and certain disorders involving the immune mechanism: Secondary | ICD-10-CM | POA: Diagnosis not present

## 2021-04-04 DIAGNOSIS — Z1329 Encounter for screening for other suspected endocrine disorder: Secondary | ICD-10-CM | POA: Diagnosis not present

## 2021-04-04 DIAGNOSIS — Z1321 Encounter for screening for nutritional disorder: Secondary | ICD-10-CM | POA: Diagnosis not present

## 2021-04-04 DIAGNOSIS — Z13228 Encounter for screening for other metabolic disorders: Secondary | ICD-10-CM | POA: Diagnosis not present

## 2021-04-05 LAB — COMPREHENSIVE METABOLIC PANEL
ALT: 18 IU/L (ref 0–44)
AST: 23 IU/L (ref 0–40)
Albumin/Globulin Ratio: 1.9 (ref 1.2–2.2)
Albumin: 4.2 g/dL (ref 3.8–4.8)
Alkaline Phosphatase: 72 IU/L (ref 44–121)
BUN/Creatinine Ratio: 20 (ref 10–24)
BUN: 19 mg/dL (ref 8–27)
Bilirubin Total: 0.6 mg/dL (ref 0.0–1.2)
CO2: 25 mmol/L (ref 20–29)
Calcium: 9.3 mg/dL (ref 8.6–10.2)
Chloride: 105 mmol/L (ref 96–106)
Creatinine, Ser: 0.95 mg/dL (ref 0.76–1.27)
Globulin, Total: 2.2 g/dL (ref 1.5–4.5)
Glucose: 100 mg/dL — ABNORMAL HIGH (ref 70–99)
Potassium: 4.9 mmol/L (ref 3.5–5.2)
Sodium: 143 mmol/L (ref 134–144)
Total Protein: 6.4 g/dL (ref 6.0–8.5)
eGFR: 87 mL/min/{1.73_m2} (ref >59)

## 2021-04-05 LAB — LIPID PANEL
Chol/HDL Ratio: 3.3 ratio (ref 0.0–5.0)
Cholesterol, Total: 135 mg/dL (ref 100–199)
HDL: 41 mg/dL (ref >39)
LDL Chol Calc (NIH): 65 mg/dL (ref 0–99)
Triglycerides: 175 mg/dL — ABNORMAL HIGH (ref 0–149)
VLDL Cholesterol Cal: 29 mg/dL (ref 5–40)

## 2021-04-05 LAB — CBC WITH DIFFERENTIAL/PLATELET
Basophils Absolute: 0.1 10*3/uL (ref 0.0–0.2)
Basos: 1 %
EOS (ABSOLUTE): 0.3 10*3/uL (ref 0.0–0.4)
Eos: 5 %
Hematocrit: 48.7 % (ref 37.5–51.0)
Hemoglobin: 15.9 g/dL (ref 13.0–17.7)
Immature Grans (Abs): 0 10*3/uL (ref 0.0–0.1)
Immature Granulocytes: 0 %
Lymphocytes Absolute: 1.4 10*3/uL (ref 0.7–3.1)
Lymphs: 26 %
MCH: 29.8 pg (ref 26.6–33.0)
MCHC: 32.6 g/dL (ref 31.5–35.7)
MCV: 91 fL (ref 79–97)
Monocytes Absolute: 0.5 10*3/uL (ref 0.1–0.9)
Monocytes: 10 %
Neutrophils Absolute: 3 10*3/uL (ref 1.4–7.0)
Neutrophils: 58 %
Platelets: 234 10*3/uL (ref 150–450)
RBC: 5.34 x10E6/uL (ref 4.14–5.80)
RDW: 12.4 % (ref 11.6–15.4)
WBC: 5.3 10*3/uL (ref 3.4–10.8)

## 2021-04-05 LAB — HEMOGLOBIN A1C
Est. average glucose Bld gHb Est-mCnc: 108 mg/dL
Hgb A1c MFr Bld: 5.4 % (ref 4.8–5.6)

## 2021-04-05 LAB — TSH: TSH: 2.16 u[IU]/mL (ref 0.450–4.500)

## 2021-04-11 ENCOUNTER — Ambulatory Visit (INDEPENDENT_AMBULATORY_CARE_PROVIDER_SITE_OTHER): Payer: Medicare PPO | Admitting: Physician Assistant

## 2021-04-11 ENCOUNTER — Encounter: Payer: Self-pay | Admitting: Physician Assistant

## 2021-04-11 VITALS — Ht 68.0 in | Wt 239.0 lb

## 2021-04-11 DIAGNOSIS — Z0000 Encounter for general adult medical examination without abnormal findings: Secondary | ICD-10-CM

## 2021-04-11 NOTE — Patient Instructions (Signed)
Preventive Care 65 Years and Older, Male °Preventive care refers to lifestyle choices and visits with your health care provider that can promote health and wellness. Preventive care visits are also called wellness exams. °What can I expect for my preventive care visit? °Counseling °During your preventive care visit, your health care provider may ask about your: °Medical history, including: °Past medical problems. °Family medical history. °History of falls. °Current health, including: °Emotional well-being. °Home life and relationship well-being. °Sexual activity. °Memory and ability to understand (cognition). °Lifestyle, including: °Alcohol, nicotine or tobacco, and drug use. °Access to firearms. °Diet, exercise, and sleep habits. °Work and work environment. °Sunscreen use. °Safety issues such as seatbelt and bike helmet use. °Physical exam °Your health care provider will check your: °Height and weight. These may be used to calculate your BMI (body mass index). BMI is a measurement that tells if you are at a healthy weight. °Waist circumference. This measures the distance around your waistline. This measurement also tells if you are at a healthy weight and may help predict your risk of certain diseases, such as type 2 diabetes and high blood pressure. °Heart rate and blood pressure. °Body temperature. °Skin for abnormal spots. °What immunizations do I need? °Vaccines are usually given at various ages, according to a schedule. Your health care provider will recommend vaccines for you based on your age, medical history, and lifestyle or other factors, such as travel or where you work. °What tests do I need? °Screening °Your health care provider may recommend screening tests for certain conditions. This may include: °Lipid and cholesterol levels. °Diabetes screening. This is done by checking your blood sugar (glucose) after you have not eaten for a while (fasting). °Hepatitis C test. °Hepatitis B test. °HIV (human  immunodeficiency virus) test. °STI (sexually transmitted infection) testing, if you are at risk. °Lung cancer screening. °Colorectal cancer screening. °Prostate cancer screening. °Abdominal aortic aneurysm (AAA) screening. You may need this if you are a current or former smoker. °Talk with your health care provider about your test results, treatment options, and if necessary, the need for more tests. °Follow these instructions at home: °Eating and drinking ° °Eat a diet that includes fresh fruits and vegetables, whole grains, lean protein, and low-fat dairy products. Limit your intake of foods with high amounts of sugar, saturated fats, and salt. °Take vitamin and mineral supplements as recommended by your health care provider. °Do not drink alcohol if your health care provider tells you not to drink. °If you drink alcohol: °Limit how much you have to 0-2 drinks a day. °Know how much alcohol is in your drink. In the [place] one drink equals one 12 oz bottle of beer (355 mL), one 5 oz glass of wine (148 mL), or one 1½ oz glass of hard liquor (44 mL). °Lifestyle °Brush your teeth every morning and night with fluoride toothpaste. Floss one time each day. °Exercise for at least 30 minutes 5 or more days each week. °Do not use any products that contain nicotine or tobacco. These products include cigarettes, chewing tobacco, and vaping devices, such as e-cigarettes. If you need help quitting, ask your health care provider. °Do not use drugs. °If you are sexually active, practice safe sex. Use a condom or other form of protection to prevent STIs. °Take aspirin only as told by your health care provider. Make sure that you understand how much to take and what form to take. Work with your health care provider to find out whether it is safe and   beneficial for you to take aspirin daily. °Ask your health care provider if you need to take a cholesterol-lowering medicine (statin). °Find healthy ways to manage stress, such  as: °Meditation, yoga, or listening to music. °Journaling. °Talking to a trusted person. °Spending time with friends and family. °Safety °Always wear your seat belt while driving or riding in a vehicle. °Do not drive: °If you have been drinking alcohol. Do not ride with someone who has been drinking. °When you are tired or distracted. °While texting. °If you have been using any mind-altering substances or drugs. °Wear a helmet and other protective equipment during sports activities. °If you have firearms in your house, make sure you follow all gun safety procedures. °Minimize exposure to UV radiation to reduce your risk of skin cancer. °What's next? °Visit your health care provider once a year for an annual wellness visit. °Ask your health care provider how often you should have your eyes and teeth checked. °Stay up to date on all vaccines. °This information is not intended to replace advice given to you by your health care provider. Make sure you discuss any questions you have with your health care provider. °Document Revised: 07/12/2020 Document Reviewed: 07/12/2020 °Elsevier Patient Education © 2022 [employer] ° °

## 2021-04-11 NOTE — Progress Notes (Addendum)
? ?Subjective:  ? Martin Norris is a 71 y.o. male who presents for Medicare Annual/Subsequent preventive examination. ? ?Review of Systems    ?Refer to PCP ? ? ?I connected with  Martin Norris on 04/11/21 by an audio only telemedicine application and verified that I am speaking with the correct person using two identifiers. ?  ?I discussed the limitations, risks, security and privacy concerns of performing an evaluation and management service by telephone and the availability of in person appointments. I also discussed with the patient that there may be a patient responsible charge related to this service. The patient expressed understanding and verbally consented to this telephonic visit. ? ?Location of Patient: Home ?Location of Provider: Office ? ?List any persons and their role that are participating in the visit with the patient.   ?Martin Norris, CMA ?   ?Objective:  ?  ?Today's Vitals  ? 04/11/21 0842  ?Weight: 239 lb (108.4 kg)  ?Height: 5\' 8"  (1.727 m)  ? ?Body mass index is 36.34 kg/m?. ? ?Advanced Directives 11/23/2019 05/22/2017  ?Does Patient Have a Medical Advance Directive? No No  ?Would patient like information on creating a medical advance directive? No - Patient declined No - Patient declined  ? ? ?Current Medications (verified) ?Outpatient Encounter Medications as of 04/11/2021  ?Medication Sig  ? aspirin EC 81 MG tablet Take 1 tablet (81 mg total) by mouth daily. Swallow whole.  ? famotidine (PEPCID) 10 MG tablet Take 10 mg by mouth daily as needed for heartburn or indigestion.  ? Ibuprofen (ADVIL PO) Take 1 tablet by mouth 3 (three) times daily as needed (pain). Duo  ? rosuvastatin (CRESTOR) 10 MG tablet TAKE 1 TABLET BY MOUTH DAILY.  ? METAMUCIL FIBER PO Take 1 Scoop by mouth daily. (Patient not taking: Reported on 04/11/2021)  ? [DISCONTINUED] COVID-19 mRNA bivalent vaccine, Pfizer, (PFIZER COVID-19 VAC BIVALENT) injection Inject into the muscle.  ? ?No facility-administered encounter  medications on file as of 04/11/2021.  ? ? ?Allergies (verified) ?Codeine and Statins  ? ?History: ?Past Medical History:  ?Diagnosis Date  ? Arthritis   ? Phreesia 04/02/2020  ? Blockage of coronary artery of heart (HCC)   ? Chronic kidney disease   ? KIDNEY STONES  ? Dizziness   ? Hyperlipidemia   ? Migraines   ? Vertigo   ? ?Past Surgical History:  ?Procedure Laterality Date  ? KNEE ARTHROSCOPY Left   ? LASIK Bilateral 2000  ? LEFT HEART CATH AND CORONARY ANGIOGRAPHY N/A 11/23/2019  ? Procedure: LEFT HEART CATH AND CORONARY ANGIOGRAPHY;  Surgeon: Belva Crome, MD;  Location: [facility];  Service: Cardiovascular;  Laterality: N/A;  ? ?Family History  ?Problem Relation Age of Onset  ? Healthy Mother   ? Macular degeneration Mother   ? Heart disease Father   ? Stroke Father   ? Hyperlipidemia Father   ? COPD Sister   ? Hypertension Brother   ? Hyperlipidemia Brother   ? Hypertension Sister   ? Hyperlipidemia Brother   ? Parkinson's disease Paternal Grandfather   ? Stroke Paternal Grandfather   ? Heart failure Paternal Grandfather   ? Colon cancer Neg Hx   ? Esophageal cancer Neg Hx   ? Rectal cancer Neg Hx   ? Stomach cancer Neg Hx   ? ?Social History  ? ?Socioeconomic History  ? Marital status: Married  ?  Spouse name: Manuela Schwartz  ? Number of children: 2  ? Years of education: Not  on file  ? Highest education level: 10th grade  ?Occupational History  ?  Comment: retired Field seismologist maintenance  ?Tobacco Use  ? Smoking status: Former  ?  Packs/day: 2.00  ?  Years: 30.00  ?  Pack years: 60.00  ?  Types: Cigarettes  ?  Quit date: 01/28/1989  ?  Years since quitting: 32.2  ? Smokeless tobacco: Never  ?Vaping Use  ? Vaping Use: Never used  ?Substance and Sexual Activity  ? Alcohol use: Yes  ?  Alcohol/week: 2.0 standard drinks  ?  Types: 2 Cans of beer per week  ?  Comment: weekends  ? Drug use: Not Currently  ?  Types: Marijuana  ?  Comment: Quit 4 years ago  ? Sexual activity: Not Currently  ?  Birth control/protection:  None  ?Other Topics Concern  ? Not on file  ?Social History Narrative  ? Lives with wife  ? Caffeine- coffee 1 c, Coke 1 daily, tea 2-3 daily  ? ?Social Determinants of Health  ? ?Financial Resource Strain: Low Risk   ? Difficulty of Paying Living Expenses: Not hard at all  ?Food Insecurity: No Food Insecurity  ? Worried About Charity fundraiser in the Last Year: Never true  ? Ran Out of Food in the Last Year: Never true  ?Transportation Needs: No Transportation Needs  ? Lack of Transportation (Medical): No  ? Lack of Transportation (Non-Medical): No  ?Physical Activity: Insufficiently Active  ? Days of Exercise per Week: 2 days  ? Minutes of Exercise per Session: 40 min  ?Stress: No Stress Concern Present  ? Feeling of Stress : Not at all  ?Social Connections: Moderately Isolated  ? Frequency of Communication with Friends and Family: More than three times a week  ? Frequency of Social Gatherings with Friends and Family: More than three times a week  ? Attends Religious Services: Never  ? Active Member of Clubs or Organizations: No  ? Attends Archivist Meetings: Never  ? Marital Status: Married  ? ? ?Tobacco Counseling ?Counseling given: Not Answered ? ? ?Clinical Intake: ? ?  ? ?  ? ?  ? ?  ? ?  ? ?Diabetic?No ? ?  ? ?  ? ? ?Activities of Daily Living ?In your present state of health, do you have any difficulty performing the following activities: 04/11/2021 10/06/2020  ?Hearing? Y Y  ?Vision? Y Y  ?Difficulty concentrating or making decisions? N N  ?Walking or climbing stairs? Y Y  ?Dressing or bathing? N N  ?Doing errands, shopping? N N  ?Some recent data might be hidden  ? ? ?Patient Care Team: ?Ellouise Newer as PCP - General (Physician Assistant) ?Jerline Pain, MD as PCP - Cardiology (Cardiology) ? ?Indicate any recent Medical Services you may have received from other than [facility] providers in the past year (date may be approximate). ? ?   ?Assessment:  ? This is a routine wellness examination  for Martin Norris. ? ?Hearing/Vision screen ?No results found. ? ?Dietary issues and exercise activities discussed: ?  ? ? Goals Addressed   ?None ?  ?Depression Screen ?PHQ 2/9 Scores 04/11/2021 10/06/2020 04/05/2020 12/07/2019 09/08/2019 10/23/2017 05/22/2017  ?PHQ - 2 Score 0 0 1 2 2  0 0  ?PHQ- 9 Score - 2 3 4 5 2 2   ?  ?Fall Risk ?Fall Risk  04/11/2021 10/06/2020 04/05/2020 12/07/2019 09/08/2019  ?Falls in the past year? 0 0 0 0 0  ?Number falls in past  yr: 0 0 - - -  ?Injury with Fall? 0 0 - - -  ?Risk for fall due to : No Fall Risks No Fall Risks - - No Fall Risks  ?Follow up Falls evaluation completed Falls evaluation completed Falls evaluation completed Falls evaluation completed Falls evaluation completed  ? ? ?FALL RISK PREVENTION PERTAINING TO THE HOME: ? ?Any stairs in or around the home? Yes  ?If so, are there any without handrails? Yes  ?Home free of loose throw rugs in walkways, pet beds, electrical cords, etc? Yes  ?Adequate lighting in your home to reduce risk of falls? Yes  ? ?ASSISTIVE DEVICES UTILIZED TO PREVENT FALLS: ? ?Life alert? No  ?Use of a cane, walker or w/c? No  ?Grab bars in the bathroom? No  ?Shower chair or bench in shower? No  ?Elevated toilet seat or a handicapped toilet? No  ? ?TIMED UP AND GO: ? ?Was the test performed? No .  ?Length of time to ambulate 10 feet:  sec.  ? ?Telehealth ? ?Cognitive Function: ?  ?  ?6CIT Screen 04/11/2021 04/05/2020  ?What Year? 0 points 0 points  ?What month? 0 points 0 points  ?What time? 0 points 0 points  ?Count back from 20 0 points 0 points  ?Months in reverse 0 points 0 points  ?Repeat phrase 0 points 0 points  ?Total Score 0 0  ? ? ?Immunizations ?Immunization History  ?Administered Date(s) Administered  ? Fluad Quad(high Dose 65+) 10/06/2020  ? Influenza, High Dose Seasonal PF 12/07/2019  ? Influenza-Unspecified 11/20/2017  ? PFIZER(Purple Top)SARS-COV-2 Vaccination 02/18/2019, 03/11/2019, 10/29/2019  ? Pension scheme manager 79yrs & up 12/14/2020   ? Tdap 05/22/2017  ? ? ?TDAP status: Up to date ? ?Flu Vaccine status: Up to date ? ?Pneumococcal vaccine status: Declined,  Education has been provided regarding the importance of this vaccine but patient

## 2021-04-17 ENCOUNTER — Other Ambulatory Visit (HOSPITAL_COMMUNITY): Payer: Self-pay

## 2021-05-03 DIAGNOSIS — M1711 Unilateral primary osteoarthritis, right knee: Secondary | ICD-10-CM | POA: Diagnosis not present

## 2021-10-05 ENCOUNTER — Other Ambulatory Visit: Payer: Self-pay | Admitting: Cardiology

## 2021-10-05 ENCOUNTER — Encounter: Payer: Self-pay | Admitting: Cardiology

## 2021-11-26 ENCOUNTER — Encounter (INDEPENDENT_AMBULATORY_CARE_PROVIDER_SITE_OTHER): Payer: Self-pay

## 2021-12-04 ENCOUNTER — Ambulatory Visit: Payer: Medicare PPO | Attending: Cardiology | Admitting: Cardiology

## 2021-12-04 ENCOUNTER — Encounter: Payer: Self-pay | Admitting: Cardiology

## 2021-12-04 VITALS — BP 120/80 | HR 83 | Ht 68.0 in | Wt 235.0 lb

## 2021-12-04 DIAGNOSIS — I25110 Atherosclerotic heart disease of native coronary artery with unstable angina pectoris: Secondary | ICD-10-CM | POA: Diagnosis not present

## 2021-12-04 DIAGNOSIS — I2510 Atherosclerotic heart disease of native coronary artery without angina pectoris: Secondary | ICD-10-CM

## 2021-12-04 DIAGNOSIS — I209 Angina pectoris, unspecified: Secondary | ICD-10-CM

## 2021-12-04 NOTE — Patient Instructions (Signed)
Medication Instructions:  The current medical regimen is effective;  continue present plan and medications.  *If you need a refill on your cardiac medications before your next appointment, please call your pharmacy*  Follow-Up: At [facility], you and your health needs are our priority.  As part of our continuing mission to provide you with exceptional heart care, we have created designated Provider Care Teams.  These Care Teams include your primary Cardiologist (physician) and Advanced Practice Providers (APPs -  Physician Assistants and Nurse Practitioners) who all work together to provide you with the care you need, when you need it.  We recommend signing up for the patient portal called "MyChart".  Sign up information is provided on this After Visit Summary.  MyChart is used to connect with patients for Virtual Visits (Telemedicine).  Patients are able to view lab/test results, encounter notes, upcoming appointments, etc.  Non-urgent messages can be sent to your provider as well.   To learn more about what you can do with MyChart, go to [email or web address].    Your next appointment:   1 year(s)  The format for your next appointment:   In Person  Provider:   Mark Skains, MD      Important Information About Sugar       

## 2021-12-04 NOTE — Progress Notes (Signed)
Cardiology Office Note:    Date:  12/04/2021   ID:  Martin Norris, DOB 24-Jul-1950, MRN [id number]  PCP:  Peggye Fothergill   [facility]  Cardiologist:  Donato Schultz, MD  Advanced Practice Provider:  No care team member to display Electrophysiologist:  None       Referring MD: Mayer Masker, PA-C     History of Present Illness:    Martin Norris is a 71 y.o. male here for the follow-up of coronary artery disease and hyperlipidemia.  Prior CT of his coronary arteries showed abnormal FFR.  He was experiencing substernal chest discomfort which he had been attributing to his hiatal hernia. Mid LAD flow-limiting disease at diagonal 2 was noted on FFR analysis, also mid circumflex was occluded and RCA appeared patent.   His cardiac catheterization as described below showed the large second obtuse marginal that is functionally occluded receiving right to left collaterals.  He also demonstrated mid stenosis of up to 75% as well as 80 to 90% first diagonal.  EF was 50%.   It was thought that if medical management did not work, he could consider CTO.  Dr. Eldridge Dace and I discussed.  His son [name] who works at [employer] at [employer], had 4 stents placed. Father had bypass as well as stroke.  Previously his LDL was 147 not able to take statins.  Now however, he is tolerating Crestor 10 mg without any myalgias.    At a previous visit, overall he appears well with no new complaints. He reported continued intermittent pain due to his hernia. He remained compliant with his medication.  Today, he appears overall well with no new complaints. He is compliant with his medications. He has been staying active.   He reports that he quit smoking a while ago.   He denies any palpitations, chest pain, shortness of breath, or peripheral edema. No lightheadedness, headaches, syncope, orthopnea, or PND.  Past Medical History:  Diagnosis Date   Arthritis    Phreesia  04/02/2020   Blockage of coronary artery of heart (HCC)    Chronic kidney disease    KIDNEY STONES   Dizziness    Hyperlipidemia    Migraines    Vertigo     Past Surgical History:  Procedure Laterality Date   KNEE ARTHROSCOPY Left    LASIK Bilateral 2000   LEFT HEART CATH AND CORONARY ANGIOGRAPHY N/A 11/23/2019   Procedure: LEFT HEART CATH AND CORONARY ANGIOGRAPHY;  Surgeon: Lyn Records, MD;  Location: [facility];  Service: Cardiovascular;  Laterality: N/A;    Current Medications: Current Meds  Medication Sig   aspirin EC 81 MG tablet Take 1 tablet (81 mg total) by mouth daily. Swallow whole.   famotidine (PEPCID) 10 MG tablet Take 10 mg by mouth daily as needed for heartburn or indigestion.   Ibuprofen (ADVIL PO) Take 1 tablet by mouth 3 (three) times daily as needed (pain). Duo   METAMUCIL FIBER PO Take 1 Scoop by mouth daily.   rosuvastatin (CRESTOR) 10 MG tablet TAKE 1 TABLET BY MOUTH DAILY.     Allergies:   Codeine and Statins   Social History   Socioeconomic History   Marital status: Married    Spouse name: [name]   Number of children: 2   Years of education: Not on file   Highest education level: 10th grade  Occupational History    Comment: retired Leisure centre manager  Tobacco Use  Smoking status: Former    Packs/day: 2.00    Years: 30.00    Total pack years: 60.00    Types: Cigarettes    Quit date: 01/28/1989    Years since quitting: 32.8   Smokeless tobacco: Never  Vaping Use   Vaping Use: Never used  Substance and Sexual Activity   Alcohol use: Yes    Alcohol/week: 2.0 standard drinks of alcohol    Types: 2 Cans of beer per week    Comment: weekends   Drug use: Not Currently    Types: Marijuana    Comment: Quit 4 years ago   Sexual activity: Not Currently    Birth control/protection: None  Other Topics Concern   Not on file  Social History Narrative   Lives with wife   Caffeine- coffee 1 c, Coke 1 daily, tea 2-3 daily   Social  Determinants of Health   Financial Resource Strain: Low Risk  (04/11/2021)   Overall Financial Resource Strain (CARDIA)    Difficulty of Paying Living Expenses: Not hard at all  Food Insecurity: No Food Insecurity (04/11/2021)   Hunger Vital Sign    Worried About Running Out of Food in the Last Year: Never true    Ran Out of Food in the Last Year: Never true  Transportation Needs: No Transportation Needs (04/11/2021)   PRAPARE - Administrator, Civil ServiceTransportation    Lack of Transportation (Medical): No    Lack of Transportation (Non-Medical): No  Physical Activity: Insufficiently Active (04/11/2021)   Exercise Vital Sign    Days of Exercise per Week: 2 days    Minutes of Exercise per Session: 40 min  Stress: No Stress Concern Present (04/11/2021)   [employer] - Occupational Stress Questionnaire    Feeling of Stress : Not at all  Social Connections: Moderately Isolated (04/11/2021)   Social Connection and Isolation Panel [NHANES]    Frequency of Communication with Friends and Family: More than three times a week    Frequency of Social Gatherings with Friends and Family: More than three times a week    Attends Religious Services: Never    Database administratorActive Member of Clubs or Organizations: No    Attends BankerClub or Organization Meetings: Never    Marital Status: Married     Family History: The patient's family history includes COPD in his sister; Healthy in his mother; Heart disease in his father; Heart failure in his paternal grandfather; Hyperlipidemia in his brother, brother, and father; Hypertension in his brother and sister; Macular degeneration in his mother; Parkinson's disease in his paternal grandfather; Stroke in his father and paternal grandfather. There is no history of Colon cancer, Esophageal cancer, Rectal cancer, or Stomach cancer.  ROS:   Please see the history of present illness.    All other systems reviewed and are negative.  EKGs/Labs/Other Studies Reviewed:    The  following studies were reviewed today:  Cardiac cath 11/23/19 Patent left main Moderately severe proximal and mid LAD disease.  Proximal and mid stenoses are 60 and 75% respectively.  80 to 90% first diagonal, and large second diagonal contains 50% ostial narrowing forming a Medina 111 bifurcation stenosis with the mid LAD stenosis.  Diagonal and continuation of LAD are equal in size. Large second obtuse marginal is functionally occluded and receives right to left collaterals from the PDA.  Collaterals appear to be well formed suggesting this is been present for quite some time. Dominant right coronary.  Large branching PDA.  Both limbs beyond  the branch point contain 40 to 50% narrowing.  The PDA gives well-formed collaterals to the obtuse marginal Low normal LV function.  EF 50%.  LVEDP is normal.   RECOMMENDATIONS:   Aggressive risk factor modification including PCSK9 therapy. Medical management of current coronary anatomy and if he becomes symptomatic consider CABG to include the diagonals LAD and obtuse marginal versus higher risk stent of the LAD (due to jailing of large diagonals). Dominance: Right     CT Coronary FFR 11/11/2019: FINDINGS: FFRct analysis was performed on the original cardiac CT angiogram dataset. Diagrammatic representation of the FFRct analysis is provided in a separate PDF document in PACS. This dictation was created using the PDF document and an interactive 3D model of the results. 3D model is not available in the EMR/PACS. Normal FFR range is >0.80.   1. Left Main: No functionally significant stenosis   2. LAD: Functionally significant stenosis in the mid LAD, FFR 0.56 with FFR 0.55 in the D2 vessel   3. LCX: Modeled as a total occlusion of the mid LCx   4. RCA: Distal, functional stenosis in the PDA.  FFR 0.76   IMPRESSION: Evidence of multi-vessel, functionally significant coronary artery disease.   Note: These examples are not recommendations of  HeartFlow and only provided as examples of what other customers are doing.  Cardiac CTA 11/11/2019:  FINDINGS: A 100 kV prospective scan was triggered in the descending thoracic aorta at 111 HU's. Axial non-contrast 3 mm slices were carried out through the heart. The data set was analyzed on a dedicated work station and scored using the Agatson method. Gantry rotation speed was 250 msecs and collimation was .6 mm. No beta blockade and 0.8 mg of sl NTG was given. The 3D data set was reconstructed in 5% intervals of the 67-82 % of the R-R cycle. Diastolic phases were analyzed on a dedicated work station using MPR, MIP and VRT modes. The patient received 80 cc of contrast.   Aorta: Normal size. Aortic calcifications at the annulus. No dissection.   Aortic Valve:  Trileaflet.  No calcifications.   Coronary Arteries:  Normal coronary origin.  Right dominance.   RCA is a large dominant artery that gives rise to PDA and PLA. There is a proximal RCA calcific lesion at least 50% (50-70%), there is a mRCA 25-49% calcific lesion.   Left main is a large artery that gives rise to LAD and LCX arteries. There is a distal 1-24% calcific lesion.   LAD is a non-dominant artery that has a proximal 0-24% calcific lesion, a mid 0-24 calcific lesion, and a distal 25-49% calcific lesion.   LCX is a non-dominant artery that gives rise to one large OM1 branch. There is a 25-49% soft plaque in the mid circumflex.   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 Otherwise normal pulmonary vein drainage into the left atrium.   Normal left atrial appendage without a thrombus.   Normal size of the pulmonary artery.   IMPRESSION: 1. Coronary calcium score of 503. This was 16 percentile for age and sex matched control.   2. Normal coronary origin with right dominance.   3. CAD-RADS 3. Moderate stenosis. Consider  symptom-guided anti-ischemic pharmacotherapy as well as risk factor modification per guideline directed care. Additional analysis with CT FFR will be submitted.  EKG: EKG is personally reviewed and interpreted. 12/04/2021: Sinus rhythm. Rate 83 bpm. 10/27/2020: Sinus rhythm.  Rate 58 bpm. PVCs.  Recent Labs: 04/04/2021: ALT 18; BUN 19; Creatinine, Ser 0.95; Hemoglobin 15.9; Platelets 234; Potassium 4.9; Sodium 143; TSH 2.160   Recent Lipid Panel    Component Value Date/Time   CHOL 135 04/04/2021 0848   TRIG 175 (H) 04/04/2021 0848   HDL 41 04/04/2021 0848   CHOLHDL 3.3 04/04/2021 0848   LDLCALC 65 04/04/2021 0848     Risk Assessment/Calculations:      Physical Exam:    VS:  BP 120/80 (BP Location: Left Arm, Patient Position: Sitting, Cuff Size: Normal)   Pulse 83   Ht 5\' 8"  (1.727 m)   Wt 235 lb (106.6 kg)   BMI 35.73 kg/m     Wt Readings from Last 3 Encounters:  12/04/21 235 lb (106.6 kg)  04/11/21 239 lb (108.4 kg)  10/27/20 239 lb (108.4 kg)     GEN: Well nourished, well developed in no acute distress HEENT: Normal NECK: No JVD; No carotid bruits LYMPHATICS: No lymphadenopathy CARDIAC:  RRR, no murmurs, rubs, gallops RESPIRATORY:  Clear to auscultation without rales, wheezing or rhonchi  ABDOMEN: Soft, non-tender, non-distended MUSCULOSKELETAL:  No edema; No deformity  SKIN: Warm and dry NEUROLOGIC:  Alert and oriented x 3 PSYCHIATRIC:  Normal affect     ASSESSMENT:    1. Coronary artery disease involving native coronary artery of native heart without angina pectoris   2. Angina pectoris (HCC)      PLAN:    In order of problems listed above:    Coronary artery disease - Previously discussed the possibility of CTO opening with Dr. 10/29/20.  May be favorable.  Currently not having any anginal symptoms.  Holding off on any further procedures at this time.  Discussed once again.  Does not make sense to try to open the CTO if there are no anginal  symptoms present.  First do no harm.  Continue with aspirin statin.  Has nitroglycerin as needed.  Hyperlipidemia with prior statin intolerance -Taking Crestor 10 mg daily without any significant myalgias.  Appreciate lipid clinic assistance.  Last LDL 65 in 2023 excellent.  At goal.    Follow-up: 1 year   Medication Adjustments/Labs and Tests Ordered: Current medicines are reviewed at length with the patient today.  Concerns regarding medicines are outlined above.   Orders Placed This Encounter  Procedures   EKG 12-Lead   No orders of the defined types were placed in this encounter.  Patient Instructions  Medication Instructions:  The current medical regimen is effective;  continue present plan and medications.  *If you need a refill on your cardiac medications before your next appointment, please call your pharmacy*  Follow-Up: At [facility], you and your health needs are our priority.  As part of our continuing mission to provide you with exceptional heart care, we have created designated Provider Care Teams.  These Care Teams include your primary Cardiologist (physician) and Advanced Practice Providers (APPs -  Physician Assistants and Nurse Practitioners) who all work together to provide you with the care you need, when you need it.  We recommend signing up for the patient portal called "MyChart".  Sign up information is provided on this After Visit Summary.  MyChart is used to connect with patients for Virtual Visits (Telemedicine).  Patients are able to view lab/test results, encounter notes, upcoming appointments, etc.  Non-urgent messages can be sent to your provider as well.   To learn more about what you can do with MyChart, go  to [email or web address].    Your next appointment:   1 year(s)  The format for your next appointment:   In Person  Provider:   Candee Furbish, MD       Important Information About Sugar         I,Rachel Rivera,acting as a  scribe for Candee Furbish, MD.,have documented all relevant documentation on the behalf of Candee Furbish, MD,as directed by  Candee Furbish, MD while in the presence of Candee Furbish, MD.  I, Candee Furbish, MD, have reviewed all documentation for this visit. The documentation on 12/04/21 for the exam, diagnosis, procedures, and orders are all accurate and complete.    Signed, Candee Furbish, MD  12/04/2021 5:23 PM    [place]

## 2021-12-12 DIAGNOSIS — H43811 Vitreous degeneration, right eye: Secondary | ICD-10-CM | POA: Diagnosis not present

## 2021-12-12 DIAGNOSIS — H2513 Age-related nuclear cataract, bilateral: Secondary | ICD-10-CM | POA: Diagnosis not present

## 2022-01-08 ENCOUNTER — Other Ambulatory Visit: Payer: Self-pay | Admitting: Cardiology

## 2022-01-12 ENCOUNTER — Other Ambulatory Visit (HOSPITAL_COMMUNITY): Payer: Self-pay

## 2022-01-16 ENCOUNTER — Other Ambulatory Visit (HOSPITAL_COMMUNITY): Payer: Self-pay

## 2022-01-25 ENCOUNTER — Telehealth: Payer: Self-pay | Admitting: *Deleted

## 2022-01-25 ENCOUNTER — Other Ambulatory Visit: Payer: Self-pay | Admitting: Nurse Practitioner

## 2022-01-25 DIAGNOSIS — J069 Acute upper respiratory infection, unspecified: Secondary | ICD-10-CM

## 2022-01-25 MED ORDER — NIRMATRELVIR/RITONAVIR (PAXLOVID)TABLET: 3.0000 | ORAL_TABLET | Freq: Two times a day (BID) | ORAL | 0 refills | Status: AC

## 2022-01-25 NOTE — Telephone Encounter (Signed)
LVM informing pt that Rx had been sent.  Zimmerman Rumple, CMA

## 2022-01-25 NOTE — Telephone Encounter (Signed)
Pt called and said he tested positive for Covid about 30 minutes ago, said symptoms started yesterday and would like to have paxlovid sent to [facility].  Zimmerman Rumple, CMA

## 2022-01-25 NOTE — Telephone Encounter (Signed)
Please let the patient know that I sent prescription for Paxlovid to [facility].  Thanks  -HB

## 2022-04-17 DIAGNOSIS — M1711 Unilateral primary osteoarthritis, right knee: Secondary | ICD-10-CM | POA: Diagnosis not present

## 2022-04-17 DIAGNOSIS — M7121 Synovial cyst of popliteal space [Baker], right knee: Secondary | ICD-10-CM | POA: Diagnosis not present

## 2022-05-02 ENCOUNTER — Other Ambulatory Visit: Payer: Self-pay

## 2022-08-28 ENCOUNTER — Emergency Department (HOSPITAL_COMMUNITY): Payer: Medicare PPO

## 2022-08-28 ENCOUNTER — Emergency Department (HOSPITAL_COMMUNITY)
Admission: EM | Admit: 2022-08-28 | Discharge: 2022-08-29 | Disposition: A | Discharge status: Home / Self Care | Payer: Medicare PPO | Attending: Emergency Medicine | Admitting: Emergency Medicine

## 2022-08-28 ENCOUNTER — Other Ambulatory Visit: Payer: Self-pay

## 2022-08-28 ENCOUNTER — Encounter (HOSPITAL_COMMUNITY): Payer: Self-pay | Admitting: Emergency Medicine

## 2022-08-28 DIAGNOSIS — I2510 Atherosclerotic heart disease of native coronary artery without angina pectoris: Secondary | ICD-10-CM | POA: Insufficient documentation

## 2022-08-28 DIAGNOSIS — R509 Fever, unspecified: Secondary | ICD-10-CM | POA: Diagnosis not present

## 2022-08-28 DIAGNOSIS — N189 Chronic kidney disease, unspecified: Secondary | ICD-10-CM | POA: Diagnosis not present

## 2022-08-28 DIAGNOSIS — R9389 Abnormal findings on diagnostic imaging of other specified body structures: Secondary | ICD-10-CM | POA: Diagnosis not present

## 2022-08-28 DIAGNOSIS — Z7982 Long term (current) use of aspirin: Secondary | ICD-10-CM | POA: Diagnosis not present

## 2022-08-28 DIAGNOSIS — N400 Enlarged prostate without lower urinary tract symptoms: Secondary | ICD-10-CM | POA: Diagnosis not present

## 2022-08-28 DIAGNOSIS — Z1152 Encounter for screening for COVID-19: Secondary | ICD-10-CM | POA: Insufficient documentation

## 2022-08-28 DIAGNOSIS — K5730 Diverticulosis of large intestine without perforation or abscess without bleeding: Secondary | ICD-10-CM | POA: Diagnosis not present

## 2022-08-28 DIAGNOSIS — R000 Tachycardia, unspecified: Secondary | ICD-10-CM | POA: Diagnosis not present

## 2022-08-28 DIAGNOSIS — N281 Cyst of kidney, acquired: Secondary | ICD-10-CM | POA: Diagnosis not present

## 2022-08-28 DIAGNOSIS — N200 Calculus of kidney: Secondary | ICD-10-CM | POA: Diagnosis not present

## 2022-08-28 DIAGNOSIS — Z0389 Encounter for observation for other suspected diseases and conditions ruled out: Secondary | ICD-10-CM | POA: Diagnosis not present

## 2022-08-28 LAB — CBC WITH DIFFERENTIAL/PLATELET
Abs Immature Granulocytes: 0.02 10*3/uL (ref 0.00–0.07)
Basophils Absolute: 0.1 10*3/uL (ref 0.0–0.1)
Basophils Relative: 1 %
Eosinophils Absolute: 0.1 10*3/uL (ref 0.0–0.5)
Eosinophils Relative: 1 %
HCT: 46.9 % (ref 39.0–52.0)
Hemoglobin: 15.4 g/dL (ref 13.0–17.0)
Immature Granulocytes: 1 %
Lymphocytes Relative: 32 %
Lymphs Abs: 1.4 10*3/uL (ref 0.7–4.0)
MCH: 30 pg (ref 26.0–34.0)
MCHC: 32.8 g/dL (ref 30.0–36.0)
MCV: 91.4 fL (ref 80.0–100.0)
Monocytes Absolute: 0.4 10*3/uL (ref 0.1–1.0)
Monocytes Relative: 8 %
Neutro Abs: 2.5 10*3/uL (ref 1.7–7.7)
Neutrophils Relative %: 57 %
Platelets: 146 10*3/uL — ABNORMAL LOW (ref 150–400)
RBC: 5.13 MIL/uL (ref 4.22–5.81)
RDW: 13.6 % (ref 11.5–15.5)
WBC: 4.4 10*3/uL (ref 4.0–10.5)
nRBC: 0 % (ref 0.0–0.2)

## 2022-08-28 LAB — PROTIME-INR
INR: 1.1 (ref 0.8–1.2)
Prothrombin Time: 13.9 seconds (ref 11.4–15.2)

## 2022-08-28 LAB — URINALYSIS, W/ REFLEX TO CULTURE (INFECTION SUSPECTED)
Bilirubin Urine: NEGATIVE
Glucose, UA: NEGATIVE mg/dL
Ketones, ur: 5 mg/dL — AB
Leukocytes,Ua: NEGATIVE
Nitrite: NEGATIVE
Protein, ur: 100 mg/dL — AB
Specific Gravity, Urine: 1.029 (ref 1.005–1.030)
pH: 5 (ref 5.0–8.0)

## 2022-08-28 LAB — COMPREHENSIVE METABOLIC PANEL
ALT: 34 U/L (ref 0–44)
AST: 41 U/L (ref 15–41)
Albumin: 4.1 g/dL (ref 3.5–5.0)
Alkaline Phosphatase: 65 U/L (ref 38–126)
Anion gap: 9 (ref 5–15)
BUN: 25 mg/dL — ABNORMAL HIGH (ref 8–23)
CO2: 26 mmol/L (ref 22–32)
Calcium: 8.4 mg/dL — ABNORMAL LOW (ref 8.9–10.3)
Chloride: 100 mmol/L (ref 98–111)
Creatinine, Ser: 1.29 mg/dL — ABNORMAL HIGH (ref 0.61–1.24)
GFR, Estimated: >60 mL/min (ref >60)
Glucose, Bld: 114 mg/dL — ABNORMAL HIGH (ref 70–99)
Potassium: 3.8 mmol/L (ref 3.5–5.1)
Sodium: 135 mmol/L (ref 135–145)
Total Bilirubin: 0.9 mg/dL (ref 0.3–1.2)
Total Protein: 7 g/dL (ref 6.5–8.1)

## 2022-08-28 LAB — RESP PANEL BY RT-PCR (RSV, FLU A&B, COVID)  RVPGX2
Influenza A by PCR: NEGATIVE
Influenza B by PCR: NEGATIVE
Resp Syncytial Virus by PCR: NEGATIVE
SARS Coronavirus 2 by RT PCR: NEGATIVE

## 2022-08-28 LAB — I-STAT CG4 LACTIC ACID, ED: Lactic Acid, Venous: 1 mmol/L (ref 0.5–1.9)

## 2022-08-28 MED ORDER — SODIUM CHLORIDE 0.9 % IV BOLUS
1000.0000 mL | Freq: Once | INTRAVENOUS | Status: AC
  Administered 2022-08-28: 1000 mL via INTRAVENOUS

## 2022-08-28 MED ORDER — ACETAMINOPHEN 325 MG PO TABS
650.0000 mg | ORAL_TABLET | Freq: Once | ORAL | Status: AC
  Administered 2022-08-28: 650 mg via ORAL
  Filled 2022-08-28: qty 2

## 2022-08-28 NOTE — ED Provider Notes (Signed)
[place] EMERGENCY DEPARTMENT AT [facility] Provider Note   CSN: [id number] Arrival date & time: 08/28/22  2010     History  Chief Complaint  Patient presents with   Fever    Martin Norris is a 72 y.o. male.  The history is provided by the patient and medical records. No language interpreter was used.  Fever    71 year old male significant history of CKD, CAD, hyperlipidemia presenting to the accompanied by spouse from home with concerns of fever.  Report for the past 3 to 4 days he has had pain to his back, strong odor in his urine which she can voice concerns for potential UTI.  He also endorsed having bodyaches and fever.  Endorsed mild shortness of breath only when the pain is intense.  Does not endorse any runny nose sneezing coughing or sore throat.  He did have some mild loose stool today but did state he ate soup yesterday.  Denies any significant burning on urination or increased urgency.  Denies any abnormal rash or any recent sick contact.  States he has been drinking cranberry juice as well as taking Advil for his symptoms without adequate relief.  Took some Tylenol this morning.  Home Medications Prior to Admission medications   Medication Sig Start Date End Date Taking? Authorizing Provider  aspirin EC 81 MG tablet Take 1 tablet (81 mg total) by mouth daily. Swallow whole. 09/14/19   Penumalli, Glenford Bayley, MD  famotidine (PEPCID) 10 MG tablet Take 10 mg by mouth daily as needed for heartburn or indigestion.    [provider]  Ibuprofen (ADVIL PO) Take 1 tablet by mouth 3 (three) times daily as needed (pain). Duo    [provider]  METAMUCIL FIBER PO Take 1 Scoop by mouth daily.    [provider]  rosuvastatin (CRESTOR) 10 MG tablet Take 1 tablet (10 mg total) by mouth daily. 01/08/22   Jake Bathe, MD      Allergies    Codeine and Statins    Review of Systems   Review of Systems  Constitutional:  Positive for fever.   All other systems reviewed and are negative.   Physical Exam Updated Vital Signs BP (!) 141/86 (BP Location: Right Arm)   Pulse (!) 108   Temp (!) 102.2 F (39 C) (Oral)   Resp (!) 21   Ht 5\' 8"  (1.727 m)   Wt 104.3 kg   SpO2 99%   BMI 34.97 kg/m  Physical Exam Vitals and nursing note reviewed.  Constitutional:      General: He is not in acute distress.    Appearance: He is well-developed.  HENT:     Head: Atraumatic.     Mouth/Throat:     Mouth: Mucous membranes are moist.  Eyes:     Conjunctiva/sclera: Conjunctivae normal.  Cardiovascular:     Rate and Rhythm: Regular rhythm. Tachycardia present.     Pulses: Normal pulses.     Heart sounds: Normal heart sounds.  Pulmonary:     Effort: Pulmonary effort is normal.     Breath sounds: Normal breath sounds. No wheezing, rhonchi or rales.  Abdominal:     Palpations: Abdomen is soft.     Tenderness: There is no abdominal tenderness. There is no right CVA tenderness or left CVA tenderness.  Musculoskeletal:        General: Normal range of motion.     Cervical back: Normal range of motion and neck supple.  No rigidity or tenderness.  Skin:    Findings: No rash.  Neurological:     Mental Status: He is alert. Mental status is at baseline.  Psychiatric:        Mood and Affect: Mood normal.     ED Results / Procedures / Treatments   Labs (all labs ordered are listed, but only abnormal results are displayed) Labs Reviewed  COMPREHENSIVE METABOLIC PANEL - Abnormal; Notable for the following components:      Result Value   Glucose, Bld 114 (*)    BUN 25 (*)    Creatinine, Ser 1.29 (*)    Calcium 8.4 (*)    All other components within normal limits  CBC WITH DIFFERENTIAL/PLATELET - Abnormal; Notable for the following components:   Platelets 146 (*)    All other components within normal limits  URINALYSIS, W/ REFLEX TO CULTURE (INFECTION SUSPECTED) - Abnormal; Notable for the following components:   Color, Urine AMBER  (*)    APPearance HAZY (*)    Hgb urine dipstick SMALL (*)    Ketones, ur 5 (*)    Protein, ur 100 (*)    Bacteria, UA RARE (*)    All other components within normal limits  RESP PANEL BY RT-PCR (RSV, FLU A&B, COVID)  [id number]  CULTURE, BLOOD (ROUTINE X 2)  CULTURE, BLOOD (ROUTINE X 2)  PROTIME-INR  I-STAT CG4 LACTIC ACID, ED  I-STAT CG4 LACTIC ACID, ED    EKG EKG Interpretation Date/Time:  Wednesday August 28 2022 20:16:40 EDT Ventricular Rate:  110 PR Interval:  164 QRS Duration:  91 QT Interval:  302 QTC Calculation: 409 R Axis:   -6  Text Interpretation: Sinus tachycardia Abnormal R-wave progression, early transition Nonspecific T abnormalities, lateral leads Confirmed by Lorre Nick ([id number]) on 08/28/2022 9:50:17 PM  Radiology CT Renal Stone Study  Result Date: 08/28/2022 CLINICAL DATA:  Abdominal pain.  Concern for kidney stone. EXAM: CT ABDOMEN AND PELVIS WITHOUT CONTRAST TECHNIQUE: Multidetector CT imaging of the abdomen and pelvis was performed following the standard protocol without IV contrast. RADIATION DOSE REDUCTION: This exam was performed according to the departmental dose-optimization program which includes automated exposure control, adjustment of the mA and/or kV according to patient size and/or use of iterative reconstruction technique. COMPARISON:  None Available. FINDINGS: Evaluation of this exam is limited in the absence of intravenous contrast. Lower chest: There is mild eventration of the right hemidiaphragm with mild right lung base compressive atelectasis. The visualized left lungs are clear. There is 3 vessel coronary vascular calcification. No intra-abdominal free air or free fluid. Hepatobiliary: The liver is upper. No biliary dilatation. The gallbladder is unremarkable. Pancreas: Unremarkable. No pancreatic ductal dilatation or surrounding inflammatory changes. Spleen: Normal in size without focal abnormality. Adrenals/Urinary Tract: The adrenal glands are  unremarkable. Vascular calcification versus a punctate nonobstructing right renal inferior pole calculus. No hydronephrosis. There is a 4.7 cm cyst in the inferior pole of the right kidney. No follow-up imaging. There is no hydronephrosis or nephrolithiasis on the left. The visualized ureters and urinary bladder appear unremarkable. Stomach/Bowel: There is sigmoid diverticulosis without active inflammatory changes. There is no bowel obstruction or active inflammation. The appendix is normal. Vascular/Lymphatic: Mild aortoiliac atherosclerotic disease. The IVC is unremarkable. No portal venous gas. There is no adenopathy. Reproductive: Mildly enlarged prostate gland measuring 4.5 cm in transverse axial diameter. The seminal vesicles are symmetric. Other: None Musculoskeletal: No acute osseous pathology. IMPRESSION: 1. No acute intra-abdominal or pelvic pathology. 2. Punctate nonobstructing  right renal inferior pole calculus no hydronephrosis or obstructing calculus. 3. Sigmoid diverticulosis. No bowel obstruction. Normal appendix. 4.  Aortic Atherosclerosis (ICD10-I70.0). Electronically Signed   By: Elgie Collard M.D.   On: 08/28/2022 23:39   DG Chest 2 View  Result Date: 08/28/2022 CLINICAL DATA:  Suspected sepsis. EXAM: CHEST - 2 VIEW COMPARISON:  CT chest 11/11/2019 FINDINGS: There is stable elevation of the RIGHT hemidiaphragm. Heart size is normal. Lungs are clear. There is no pulmonary edema. IMPRESSION: No active cardiopulmonary disease. Electronically Signed   By: Norva Pavlov M.D.   On: 08/28/2022 21:47    Procedures Procedures    Medications Ordered in ED Medications  acetaminophen (TYLENOL) tablet 650 mg (650 mg Oral Given 08/28/22 2130)  sodium chloride 0.9 % bolus 1,000 mL (1,000 mLs Intravenous Bolus 08/28/22 2130)    ED Course/ Medical Decision Making/ A&P                                 Medical Decision Making Amount and/or Complexity of Data Reviewed Labs:  ordered. Radiology: ordered.  Risk OTC drugs.   BP (!) 141/86 (BP Location: Right Arm)   Pulse (!) 108   Temp (!) 102.2 F (39 C) (Oral)   Resp (!) 21   Ht 5\' 8"  (1.727 m)   Wt 104.3 kg   SpO2 99%   BMI 34.97 kg/m   24:24 PM  72 year old male significant history of CKD, CAD, hyperlipidemia presenting to the accompanied by spouse from home with concerns of fever.  Report for the past 3 to 4 days he has had pain to his back, strong odor in his urine which she can voice concerns for potential UTI.  He also endorsed having bodyaches and fever.  Endorsed mild shortness of breath only when the pain is intense.  Does not endorse any runny nose sneezing coughing or sore throat.  He did have some mild loose stool today but did state he ate soup yesterday.  Denies any significant burning on urination or increased urgency.  Denies any abnormal rash or any recent sick contact.  States he has been drinking cranberry juice as well as taking Advil for his symptoms without adequate relief.  Took some Tylenol this morning.  On exam patient is overall well-appearing.  Mildly tachycardic on exam, lungs are clear to auscultation abdomen is soft nontender no CVA tenderness.  Vital signs notable for an oral temperature of 102.2 tachycardia with a heart rate of 108, mild tachypneic with a respiratory of 21 blood pressure is 141/86, O2 sat is 99% on room air.  Tylenol was given for fever, will give IV fluid as well, sepsis workup initiated.  -Labs ordered, independently viewed and interpreted by me.  Labs remarkable for negative covid/flu/rsv.  UA without UTI.  Normal WBC -The patient was maintained on a cardiac monitor.  I personally viewed and interpreted the cardiac monitored which showed an underlying rhythm of: sinus tachycardia -Imaging independently viewed and interpreted by me and I agree with radiologist's interpretation.  Result remarkable for CXR unremarkable.  Abd/pelvis CT without acute  finding -This patient presents to the ED for concern of fever, this involves an extensive number of treatment options, and is a complaint that carries with it a high risk of complications and morbidity.  The differential diagnosis includes UTI, PNA, cellulitis, meningitis, pyelonephritis, appendicitis, colitis, diverticulitis -Co morbidities that complicate the patient evaluation includes CKD, HLD -Treatment  includes tylenol, IVF -Reevaluation of the patient after these medicines showed that the patient improved -PCP office notes or outside notes reviewed -Discussion with specialist attending Dr. Freida Busman -Escalation to admission/observation considered: patients feels much better, is comfortable with discharge, and will follow up with PCP -Prescription medication considered, patient comfortable with tylenol -Social Determinant of Health considered which includes tobacco use, lack of activity, social isolation  12:03 AM Work up overall reassuring. No obvious source of infection identified.  Offer hospital admission for monitoring but pt prefers to go home and f/u with PCP.  Fever improves with tylenol.  Stable for discharge.        Final Clinical Impression(s) / ED Diagnoses Final diagnoses:  Fever of undetermined origin    Rx / DC Orders ED Discharge Orders          Ordered    acetaminophen (TYLENOL) 500 MG tablet  Every 6 hours PRN        08/29/22 0000              Fayrene Helper, PA-C 08/29/22 0004    Lorre Nick, MD 08/30/22 1140

## 2022-08-28 NOTE — ED Triage Notes (Signed)
Patient coming to ED for evaluation of back pain, SHOB, and fever.  Reports symptoms started about one week ago.  States "I think it might be a UTI, but my back pain is getting better."  Fever noted in triage.  Last dose of Tylenol was around  8 AM this morning.  No reports of dysuria or cough.

## 2022-08-29 MED ORDER — ACETAMINOPHEN 500 MG PO TABS: 500.0000 mg | ORAL_TABLET | Freq: Four times a day (QID) | ORAL | 0 refills | Status: DC | PRN

## 2022-08-29 NOTE — Discharge Instructions (Addendum)
You have been evaluated for your symptoms.  Fortunately no concerning finding were noted on today's exam.  Take tylenol as needed for fever.  Follow up with your doctor for further care.  Return if your condition worsen or if you have other concerns.

## 2022-09-04 ENCOUNTER — Ambulatory Visit: Payer: Medicare PPO | Admitting: Family Medicine

## 2022-09-04 ENCOUNTER — Emergency Department (HOSPITAL_BASED_OUTPATIENT_CLINIC_OR_DEPARTMENT_OTHER)
Admission: EM | Admit: 2022-09-04 | Discharge: 2022-09-04 | Disposition: A | Discharge status: Home / Self Care | Payer: Medicare PPO | Source: Home / Self Care | Attending: Emergency Medicine | Admitting: Emergency Medicine

## 2022-09-04 ENCOUNTER — Encounter: Payer: Self-pay | Admitting: Family Medicine

## 2022-09-04 ENCOUNTER — Encounter (HOSPITAL_BASED_OUTPATIENT_CLINIC_OR_DEPARTMENT_OTHER): Payer: Self-pay | Admitting: Radiology

## 2022-09-04 ENCOUNTER — Emergency Department (HOSPITAL_BASED_OUTPATIENT_CLINIC_OR_DEPARTMENT_OTHER): Payer: Medicare PPO

## 2022-09-04 ENCOUNTER — Other Ambulatory Visit: Payer: Self-pay

## 2022-09-04 VITALS — BP 124/82 | HR 117 | Temp 97.8°F | Ht 67.0 in | Wt 233.6 lb

## 2022-09-04 DIAGNOSIS — Z1152 Encounter for screening for COVID-19: Secondary | ICD-10-CM | POA: Insufficient documentation

## 2022-09-04 DIAGNOSIS — E860 Dehydration: Secondary | ICD-10-CM | POA: Insufficient documentation

## 2022-09-04 DIAGNOSIS — R002 Palpitations: Secondary | ICD-10-CM | POA: Insufficient documentation

## 2022-09-04 DIAGNOSIS — R000 Tachycardia, unspecified: Secondary | ICD-10-CM | POA: Diagnosis not present

## 2022-09-04 DIAGNOSIS — J9811 Atelectasis: Secondary | ICD-10-CM | POA: Diagnosis not present

## 2022-09-04 DIAGNOSIS — R9389 Abnormal findings on diagnostic imaging of other specified body structures: Secondary | ICD-10-CM | POA: Diagnosis not present

## 2022-09-04 DIAGNOSIS — D72829 Elevated white blood cell count, unspecified: Secondary | ICD-10-CM | POA: Diagnosis not present

## 2022-09-04 DIAGNOSIS — K529 Noninfective gastroenteritis and colitis, unspecified: Secondary | ICD-10-CM | POA: Diagnosis not present

## 2022-09-04 DIAGNOSIS — Z7982 Long term (current) use of aspirin: Secondary | ICD-10-CM | POA: Diagnosis not present

## 2022-09-04 DIAGNOSIS — K449 Diaphragmatic hernia without obstruction or gangrene: Secondary | ICD-10-CM

## 2022-09-04 DIAGNOSIS — R822 Biliuria: Secondary | ICD-10-CM | POA: Insufficient documentation

## 2022-09-04 DIAGNOSIS — I2510 Atherosclerotic heart disease of native coronary artery without angina pectoris: Secondary | ICD-10-CM | POA: Diagnosis not present

## 2022-09-04 DIAGNOSIS — M545 Low back pain, unspecified: Secondary | ICD-10-CM

## 2022-09-04 DIAGNOSIS — N281 Cyst of kidney, acquired: Secondary | ICD-10-CM | POA: Diagnosis not present

## 2022-09-04 DIAGNOSIS — R918 Other nonspecific abnormal finding of lung field: Secondary | ICD-10-CM | POA: Diagnosis not present

## 2022-09-04 LAB — D-DIMER, QUANTITATIVE: D-Dimer, Quant: 2.72 ug/mL-FEU — ABNORMAL HIGH (ref 0.00–0.50)

## 2022-09-04 LAB — URINALYSIS, ROUTINE W REFLEX MICROSCOPIC
Glucose, UA: NEGATIVE mg/dL
Ketones, ur: 15 mg/dL — AB
Leukocytes,Ua: NEGATIVE
Nitrite: NEGATIVE
Protein, ur: >=300 mg/dL — AB
Specific Gravity, Urine: >=1.03 (ref 1.005–1.030)
pH: 5.5 (ref 5.0–8.0)

## 2022-09-04 LAB — CBC WITH DIFFERENTIAL/PLATELET
Abs Immature Granulocytes: 0.06 10*3/uL (ref 0.00–0.07)
Basophils Absolute: 0.2 10*3/uL — ABNORMAL HIGH (ref 0.0–0.1)
Basophils Relative: 1 %
Eosinophils Absolute: 0 10*3/uL (ref 0.0–0.5)
Eosinophils Relative: 0 %
HCT: 43.2 % (ref 39.0–52.0)
Hemoglobin: 14.6 g/dL (ref 13.0–17.0)
Immature Granulocytes: 0 %
Lymphocytes Relative: 63 %
Lymphs Abs: 8.5 10*3/uL — ABNORMAL HIGH (ref 0.7–4.0)
MCH: 30.1 pg (ref 26.0–34.0)
MCHC: 33.8 g/dL (ref 30.0–36.0)
MCV: 89.1 fL (ref 80.0–100.0)
Monocytes Absolute: 0.7 10*3/uL (ref 0.1–1.0)
Monocytes Relative: 5 %
Neutro Abs: 4.1 10*3/uL (ref 1.7–7.7)
Neutrophils Relative %: 31 %
Platelets: 257 10*3/uL (ref 150–400)
RBC: 4.85 MIL/uL (ref 4.22–5.81)
RDW: 14.4 % (ref 11.5–15.5)
WBC: 13.6 10*3/uL — ABNORMAL HIGH (ref 4.0–10.5)
nRBC: 0 % (ref 0.0–0.2)

## 2022-09-04 LAB — COMPREHENSIVE METABOLIC PANEL
ALT: 39 U/L (ref 0–44)
AST: 50 U/L — ABNORMAL HIGH (ref 15–41)
Albumin: 3.1 g/dL — ABNORMAL LOW (ref 3.5–5.0)
Alkaline Phosphatase: 46 U/L (ref 38–126)
Anion gap: 13 (ref 5–15)
BUN: 20 mg/dL (ref 8–23)
CO2: 24 mmol/L (ref 22–32)
Calcium: 8.2 mg/dL — ABNORMAL LOW (ref 8.9–10.3)
Chloride: 94 mmol/L — ABNORMAL LOW (ref 98–111)
Creatinine, Ser: 1.38 mg/dL — ABNORMAL HIGH (ref 0.61–1.24)
GFR, Estimated: 55 mL/min — ABNORMAL LOW (ref >60)
Glucose, Bld: 124 mg/dL — ABNORMAL HIGH (ref 70–99)
Potassium: 4.2 mmol/L (ref 3.5–5.1)
Sodium: 131 mmol/L — ABNORMAL LOW (ref 135–145)
Total Bilirubin: 0.9 mg/dL (ref 0.3–1.2)
Total Protein: 6 g/dL — ABNORMAL LOW (ref 6.5–8.1)

## 2022-09-04 LAB — URINALYSIS, MICROSCOPIC (REFLEX)

## 2022-09-04 LAB — TROPONIN I (HIGH SENSITIVITY)
Troponin I (High Sensitivity): 15 ng/L (ref <18)
Troponin I (High Sensitivity): 14 ng/L (ref <18)

## 2022-09-04 LAB — LACTIC ACID, PLASMA: Lactic Acid, Venous: 1.6 mmol/L (ref 0.5–1.9)

## 2022-09-04 LAB — MAGNESIUM: Magnesium: 1.8 mg/dL (ref 1.7–2.4)

## 2022-09-04 LAB — LIPASE, BLOOD: Lipase: 33 U/L (ref 11–51)

## 2022-09-04 LAB — SARS CORONAVIRUS 2 BY RT PCR: SARS Coronavirus 2 by RT PCR: NEGATIVE

## 2022-09-04 MED ORDER — SODIUM CHLORIDE 0.9 % IV BOLUS
500.0000 mL | Freq: Once | INTRAVENOUS | Status: AC
  Administered 2022-09-04: 500 mL via INTRAVENOUS

## 2022-09-04 MED ORDER — IOHEXOL 350 MG/ML SOLN
100.0000 mL | Freq: Once | INTRAVENOUS | Status: AC | PRN
  Administered 2022-09-04: 100 mL via INTRAVENOUS

## 2022-09-04 NOTE — ED Provider Notes (Signed)
[place] EMERGENCY DEPARTMENT AT [facility] Provider Note   CSN: [id number] Arrival date & time: 09/04/22  1029     History {Add pertinent medical, surgical, social history, OB history to HPI:1} Chief Complaint  Patient presents with   Palpitations    Martin Norris is a 72 y.o. male.  He has a history of coronary disease and follows with Dr. Anne Fu.  He was seen over a week ago at [facility] for fever.  York Spaniel he was having a fever up to 102.  Dark urine.  Pain in his back.  He had labs and cultures, CT renal without clear etiology identified.  He said symptoms continued at home and he talked to his son who is a doctor who prescribed him amoxicillin.  He took 4 days of that and his fevers have improved.  Continues to not feel well, feels his heart pulsating in his ears.  Went to his primary care doctor today and they referred him here for tachycardia.  He does not have any chest pain.  He has had a little bit of cough.  No sore throat.  Nausea and some dry heaves earlier this week.  No abdominal pain.  Has had a few episodes of loose stools but that started after the antibiotics.  No urinary symptoms although urine is dark.  No rashes or swollen joints no tick bites.  The history is provided by the patient.  Palpitations Palpitations quality:  Fast Onset quality:  Gradual Duration:  1 week Timing:  Constant Progression:  Unchanged Chronicity:  New Relieved by:  Nothing Worsened by:  Nothing Ineffective treatments:  None tried Associated symptoms: back pain, cough, malaise/fatigue, nausea and vomiting   Associated symptoms: no chest pain, no lower extremity edema and no shortness of breath   Risk factors: heart disease        Home Medications Prior to Admission medications   Medication Sig Start Date End Date Taking? Authorizing Provider  acetaminophen (TYLENOL) 500 MG tablet Take 1 tablet (500 mg total) by mouth every 6 (six) hours as needed. 08/29/22   Fayrene Helper,  PA-C  aspirin EC 81 MG tablet Take 1 tablet (81 mg total) by mouth daily. Swallow whole. Patient taking differently: Take 81 mg by mouth every evening. Swallow whole. 09/14/19   Penumalli, Glenford Bayley, MD  Ibuprofen (ADVIL PO) Take 200 mg by mouth 3 (three) times daily as needed (pain). Duo    [provider]  rosuvastatin (CRESTOR) 10 MG tablet Take 1 tablet (10 mg total) by mouth daily. 01/08/22   Jake Bathe, MD      Allergies    Codeine and Statins    Review of Systems   Review of Systems  Constitutional:  Positive for fever and malaise/fatigue.  Eyes:  Negative for visual disturbance.  Respiratory:  Positive for cough. Negative for shortness of breath.   Cardiovascular:  Positive for palpitations. Negative for chest pain.  Gastrointestinal:  Positive for nausea and vomiting. Negative for abdominal pain.  Genitourinary:  Negative for dysuria.  Musculoskeletal:  Positive for back pain.  Skin:  Negative for rash.    Physical Exam Updated Vital Signs BP 125/88   Pulse (!) 106   Temp 98.9 F (37.2 C) (Oral)   Resp 16   Ht 5\' 8"  (1.727 m)   Wt 105.7 kg   SpO2 96%   BMI 35.43 kg/m  Physical Exam Vitals and nursing note reviewed.  Constitutional:  General: He is not in acute distress.    Appearance: Normal appearance. He is well-developed.  HENT:     Head: Normocephalic and atraumatic.  Eyes:     Conjunctiva/sclera: Conjunctivae normal.  Cardiovascular:     Rate and Rhythm: Regular rhythm. Tachycardia present.     Heart sounds: No murmur heard. Pulmonary:     Effort: Pulmonary effort is normal. No respiratory distress.     Breath sounds: Normal breath sounds.  Abdominal:     Palpations: Abdomen is soft.     Tenderness: There is no abdominal tenderness. There is no guarding or rebound.  Musculoskeletal:        General: No deformity. Normal range of motion.     Cervical back: Neck supple.     Right lower leg: No edema.     Left lower leg: No edema.   Skin:    General: Skin is warm and dry.     Capillary Refill: Capillary refill takes less than 2 seconds.  Neurological:     General: No focal deficit present.     Mental Status: He is alert.     Sensory: No sensory deficit.     Motor: No weakness.     ED Results / Procedures / Treatments   Labs (all labs ordered are listed, but only abnormal results are displayed) Labs Reviewed  CBC WITH DIFFERENTIAL/PLATELET - Abnormal; Notable for the following components:      Result Value   WBC 13.6 (*)    Lymphs Abs 8.5 (*)    Basophils Absolute 0.2 (*)    All other components within normal limits  COMPREHENSIVE METABOLIC PANEL - Abnormal; Notable for the following components:   Sodium 131 (*)    Chloride 94 (*)    Glucose, Bld 124 (*)    Creatinine, Ser 1.38 (*)    Calcium 8.2 (*)    Total Protein 6.0 (*)    Albumin 3.1 (*)    AST 50 (*)    GFR, Estimated 55 (*)    All other components within normal limits  D-DIMER, QUANTITATIVE - Abnormal; Notable for the following components:   D-Dimer, Quant 2.72 (*)    All other components within normal limits  SARS CORONAVIRUS 2 BY RT PCR  CULTURE, BLOOD (ROUTINE X 2)  CULTURE, BLOOD (ROUTINE X 2)  LACTIC ACID, PLASMA  LIPASE, BLOOD  URINALYSIS, ROUTINE W REFLEX MICROSCOPIC  MAGNESIUM  TROPONIN I (HIGH SENSITIVITY)  TROPONIN I (HIGH SENSITIVITY)    EKG EKG Interpretation Date/Time:  Wednesday September 04 2022 10:36:08 EDT Ventricular Rate:  108 PR Interval:  156 QRS Duration:  92 QT Interval:  344 QTC Calculation: 462 R Axis:   -4  Text Interpretation: Sinus tachycardia Multiple ventricular premature complexes Abnormal R-wave progression, early transition Nonspecific T abnormalities, lateral leads increased ectopy from prior 7/24 Confirmed by Meridee Score [phone]) on 09/04/2022 10:45:59 AM  Radiology DG Chest Portable 1 View  Result Date: 09/04/2022 CLINICAL DATA:  Palpitations EXAM: PORTABLE CHEST 1 VIEW COMPARISON:  X-ray  08/28/2022 FINDINGS: Elevated right hemidiaphragm. No consolidation, pneumothorax or effusion. No edema. Normal cardiopericardial silhouette. Films are under penetrated. IMPRESSION: Elevated right hemidiaphragm.  No acute cardiopulmonary disease Electronically Signed   By: Karen Kays M.D.   On: 09/04/2022 11:59    Procedures Procedures  {Document cardiac monitor, telemetry assessment procedure when appropriate:1}  Medications Ordered in ED Medications  sodium chloride 0.9 % bolus 500 mL (has no administration in time range)    ED  Course/ Medical Decision Making/ A&P Clinical Course as of 09/04/22 1429  Wed Sep 04, 2022  1129 Chest x-ray with elevated right hemidiaphragm no acute changes from prior.  Awaiting radiology reading. [MB]    Clinical Course User Index [MB] Terrilee Files, MD   {   Click here for ABCD2, HEART and other calculatorsREFRESH Note before signing :1}                              Medical Decision Making Amount and/or Complexity of Data Reviewed Labs: ordered. Radiology: ordered.  Risk Prescription drug management.   This patient complains of ***; this involves an extensive number of treatment Options and is a complaint that carries with it a high risk of complications and morbidity. The differential includes ***  I ordered, reviewed and interpreted labs, which included *** I ordered medication *** and reviewed PMP when indicated. I ordered imaging studies which included *** and I independently    visualized and interpreted imaging which showed *** Additional history obtained from *** Previous records obtained and reviewed *** I consulted *** and discussed lab and imaging findings and discussed disposition.  Cardiac monitoring reviewed, *** Social determinants considered, *** Critical Interventions: ***  After the interventions stated above, I reevaluated the patient and found *** Admission and further testing considered, ***   {Document  critical care time when appropriate:1} {Document review of labs and clinical decision tools ie heart score, Chads2Vasc2 etc:1}  {Document your independent review of radiology images, and any outside records:1} {Document your discussion with family members, caretakers, and with consultants:1} {Document social determinants of health affecting pt's care:1} {Document your decision making why or why not admission, treatments were needed:1} Final Clinical Impression(s) / ED Diagnoses Final diagnoses:  None    Rx / DC Orders ED Discharge Orders     None

## 2022-09-04 NOTE — Progress Notes (Signed)
Assessment/Plan:   Problem List Items Addressed This Visit   None Visit Diagnoses     Low back pain, unspecified back pain laterality, unspecified chronicity, unspecified whether sciatica present    -  Primary   Hiatal hernia       Tachycardia       Relevant Orders   EKG 12-Lead (Completed)   Gastroenteritis       Relevant Orders   EKG 12-Lead (Completed)      Assessment: Patient presents with persistent sinus tachycardia, frequent multiform ectopic ventricular beats, progressive nonspecific ST depression, and nonspecific T wave abnormalities, with a history of significant cardiac CAD and multiple arterial blockages.  Concern for secondary GI infection from persistent diarrhea likely from recent Augmentin use.  Plan: Due to the concern for possible cardiac strain or NSTEMI related to ongoing infection, immediate referral to the emergency department is recommended. The patient was informed of the risks and agreed to go, declining emergency services.  Medications Discontinued During This Encounter  Medication Reason   amoxicillin-clavulanate (AUGMENTIN) 500-125 MG tablet     Return in about 1 week (around 09/11/2022).    Subjective:   Encounter date: 09/04/2022  TAHEIM FITT is a 72 y.o. male who has Hallux limitus of right foot; Healthcare maintenance; Hyperlipidemia; CAD (coronary artery disease), native coronary artery; Angina pectoris (HCC); Statin intolerance; Pain in right knee; Synovial cyst of right popliteal space; and Osteoarthritis of knee on their problem list..   He  has a past medical history of Arthritis, Blockage of coronary artery of heart (HCC), Chronic kidney disease, Dizziness, GERD (gastroesophageal reflux disease), Hyperlipidemia, Migraines, and Vertigo.Marland Kitchen   He presents with chief complaint of Establish Care (Lower back pain x 2 weeks. low grade fever, fatigue, sob. Tested for UA and covid and rsv at [facility]. Cholesterol blood work requested) .    History of Present Illness: Dizziness and Imbalance. Patient presents with persistent dizziness and off-balance sensations for the past several days. The dizziness was thought to be due to an inner ear infection, for which the patient took Augmentin. Lower Back Pain. Patient reports recent lower back pain that has somewhat improved since the initial presentation. Diarrhea and Abdominal Discomfort. Patient has had significant diarrhea and abdominal discomfort, making it difficult to eat or drink. The patient suspects potential involvement of hiatal or umbilical hernias, which have been present for years. Fever and Tachycardia. The patient had a fever a few days ago, which has since resolved. He currently experiences persistent tachycardia, with a heart rate documented at 111 BPM.  Patient with thorough and reassuring emergency department workup on 08/28/2022 including negative CT abdomen pelvis for stone or active diverticulitis, negative chest x-ray, negative blood cultures, negative CMP, CBC, PT INR, lactic acid, urinalysis.  ECG with sinus tachycardia and nonspecific ST changes.  ROS  Past Surgical History:  Procedure Laterality Date   EYE SURGERY     KNEE ARTHROSCOPY Left    LASIK Bilateral 2000   LEFT HEART CATH AND CORONARY ANGIOGRAPHY N/A 11/23/2019   Procedure: LEFT HEART CATH AND CORONARY ANGIOGRAPHY;  Surgeon: Lyn Records, MD;  Location: [facility];  Service: Cardiovascular;  Laterality: N/A;    Outpatient Medications Prior to Visit  Medication Sig Dispense Refill   acetaminophen (TYLENOL) 500 MG tablet Take 1 tablet (500 mg total) by mouth every 6 (six) hours as needed. 90 tablet 0   aspirin EC 81 MG tablet Take 1 tablet (81 mg total) by mouth  daily. Swallow whole. (Patient taking differently: Take 81 mg by mouth every evening. Swallow whole.)     Ibuprofen (ADVIL PO) Take 200 mg by mouth 3 (three) times daily as needed (pain). Duo     rosuvastatin (CRESTOR) 10 MG  tablet Take 1 tablet (10 mg total) by mouth daily. 90 tablet 3   amoxicillin-clavulanate (AUGMENTIN) 500-125 MG tablet Take 1 tablet by mouth 3 (three) times daily.     No facility-administered medications prior to visit.    Family History  Problem Relation Age of Onset   Healthy Mother    Macular degeneration Mother    Arthritis Mother    Heart disease Father    Stroke Father    Hyperlipidemia Father    COPD Sister    Hypertension Brother    Hyperlipidemia Brother    Hypertension Sister    Hyperlipidemia Brother    Parkinson's disease Paternal Grandfather    Stroke Paternal Grandfather    Heart failure Paternal Grandfather    Colon cancer Neg Hx    Esophageal cancer Neg Hx    Rectal cancer Neg Hx    Stomach cancer Neg Hx     Social History   Socioeconomic History   Marital status: Married    Spouse name: [name]   Number of children: 2   Years of education: Not on file   Highest education level: 10th grade  Occupational History    Comment: retired Materials engineer maintenance  Tobacco Use   Smoking status: Former    Current packs/day: 0.00    Average packs/day: 2.0 packs/day for 30.0 years (60.0 ttl pk-yrs)    Types: Cigarettes    Start date: 01/29/1959    Quit date: 01/28/1989    Years since quitting: 33.6   Smokeless tobacco: Never  Vaping Use   Vaping status: Never Used  Substance and Sexual Activity   Alcohol use: Yes    Alcohol/week: 2.0 standard drinks of alcohol    Types: 2 Cans of beer per week    Comment: weekends   Drug use: Not Currently    Types: Marijuana    Comment: Quit 4 years ago   Sexual activity: Not Currently    Birth control/protection: None  Other Topics Concern   Not on file  Social History Narrative   Lives with wife   Caffeine- coffee 1 c, Coke 1 daily, tea 2-3 daily   Social Determinants of Health   Financial Resource Strain: Low Risk  (04/11/2021)   Overall Financial Resource Strain (CARDIA)    Difficulty of Paying Living Expenses: Not  hard at all  Food Insecurity: No Food Insecurity (04/11/2021)   Hunger Vital Sign    Worried About Running Out of Food in the Last Year: Never true    Ran Out of Food in the Last Year: Never true  Transportation Needs: No Transportation Needs (04/11/2021)   PRAPARE - Administrator, Civil Service (Medical): No    Lack of Transportation (Non-Medical): No  Physical Activity: Insufficiently Active (04/11/2021)   Exercise Vital Sign    Days of Exercise per Week: 2 days    Minutes of Exercise per Session: 40 min  Stress: No Stress Concern Present (04/11/2021)   [employer] of Occupational Health - Occupational Stress Questionnaire    Feeling of Stress : Not at all  Social Connections: Moderately Isolated (04/11/2021)   Social Connection and Isolation Panel [NHANES]    Frequency of Communication with Friends and Family: More than three times  a week    Frequency of Social Gatherings with Friends and Family: More than three times a week    Attends Religious Services: Never    Database administrator or Organizations: No    Attends Banker Meetings: Never    Marital Status: Married  Catering manager Violence: Not At Risk (04/11/2021)   Humiliation, Afraid, Rape, and Kick questionnaire    Fear of Current or Ex-Partner: No    Emotionally Abused: No    Physically Abused: No    Sexually Abused: No                                                                                                  Objective:  Physical Exam: BP 124/82 (BP Location: Left Arm, Patient Position: Sitting, Cuff Size: Large)   Pulse (!) 117   Temp 97.8 F (36.6 C) (Temporal)   Ht 5\' 7"  (1.702 m)   Wt 233 lb 9.6 oz (106 kg)   SpO2 99%   BMI 36.59 kg/m     Physical Exam  CT Renal Stone Study  Result Date: 08/28/2022 CLINICAL DATA:  Abdominal pain.  Concern for kidney stone. EXAM: CT ABDOMEN AND PELVIS WITHOUT CONTRAST TECHNIQUE: Multidetector CT imaging of the abdomen and pelvis was  performed following the standard protocol without IV contrast. RADIATION DOSE REDUCTION: This exam was performed according to the departmental dose-optimization program which includes automated exposure control, adjustment of the mA and/or kV according to patient size and/or use of iterative reconstruction technique. COMPARISON:  None Available. FINDINGS: Evaluation of this exam is limited in the absence of intravenous contrast. Lower chest: There is mild eventration of the right hemidiaphragm with mild right lung base compressive atelectasis. The visualized left lungs are clear. There is 3 vessel coronary vascular calcification. No intra-abdominal free air or free fluid. Hepatobiliary: The liver is upper. No biliary dilatation. The gallbladder is unremarkable. Pancreas: Unremarkable. No pancreatic ductal dilatation or surrounding inflammatory changes. Spleen: Normal in size without focal abnormality. Adrenals/Urinary Tract: The adrenal glands are unremarkable. Vascular calcification versus a punctate nonobstructing right renal inferior pole calculus. No hydronephrosis. There is a 4.7 cm cyst in the inferior pole of the right kidney. No follow-up imaging. There is no hydronephrosis or nephrolithiasis on the left. The visualized ureters and urinary bladder appear unremarkable. Stomach/Bowel: There is sigmoid diverticulosis without active inflammatory changes. There is no bowel obstruction or active inflammation. The appendix is normal. Vascular/Lymphatic: Mild aortoiliac atherosclerotic disease. The IVC is unremarkable. No portal venous gas. There is no adenopathy. Reproductive: Mildly enlarged prostate gland measuring 4.5 cm in transverse axial diameter. The seminal vesicles are symmetric. Other: None Musculoskeletal: No acute osseous pathology. IMPRESSION: 1. No acute intra-abdominal or pelvic pathology. 2. Punctate nonobstructing right renal inferior pole calculus no hydronephrosis or obstructing calculus. 3.  Sigmoid diverticulosis. No bowel obstruction. Normal appendix. 4.  Aortic Atherosclerosis (ICD10-I70.0). Electronically Signed   By: Elgie Collard M.D.   On: 08/28/2022 23:39   DG Chest 2 View  Result Date: 08/28/2022 CLINICAL DATA:  Suspected sepsis. EXAM: CHEST - 2 VIEW COMPARISON:  CT chest 11/11/2019 FINDINGS: There is stable elevation of the RIGHT hemidiaphragm. Heart size is normal. Lungs are clear. There is no pulmonary edema. IMPRESSION: No active cardiopulmonary disease. Electronically Signed   By: Norva Pavlov M.D.   On: 08/28/2022 21:47    Recent Results (from the past 2160 hour(s))  Comprehensive metabolic panel     Status: Abnormal   Collection Time: 08/28/22  8:27 PM  Result Value Ref Range   Sodium 135 135 - 145 mmol/L   Potassium 3.8 3.5 - 5.1 mmol/L   Chloride 100 98 - 111 mmol/L   CO2 26 22 - 32 mmol/L   Glucose, Bld 114 (H) 70 - 99 mg/dL    Comment: Glucose reference range applies only to samples taken after fasting for at least 8 hours.   BUN 25 (H) 8 - 23 mg/dL   Creatinine, Ser 1.30 (H) 0.61 - 1.24 mg/dL   Calcium 8.4 (L) 8.9 - 10.3 mg/dL   Total Protein 7.0 6.5 - 8.1 g/dL   Albumin 4.1 3.5 - 5.0 g/dL   AST 41 15 - 41 U/L   ALT 34 0 - 44 U/L   Alkaline Phosphatase 65 38 - 126 U/L   Total Bilirubin 0.9 0.3 - 1.2 mg/dL   GFR, Estimated >86 >57 mL/min    Comment: (NOTE) Calculated using the CKD-EPI Creatinine Equation (2021)    Anion gap 9 5 - 15    Comment: Performed at [facility], [address], [place], [place] [address]  CBC with Differential     Status: Abnormal   Collection Time: 08/28/22  8:27 PM  Result Value Ref Range   WBC 4.4 4.0 - 10.5 K/uL   RBC 5.13 4.22 - 5.81 MIL/uL   Hemoglobin 15.4 13.0 - 17.0 g/dL   HCT 29.5 28.4 - 13.2 %   MCV 91.4 80.0 - 100.0 fL   MCH 30.0 26.0 - 34.0 pg   MCHC 32.8 30.0 - 36.0 g/dL   RDW 44.0 10.2 - 72.5 %   Platelets 146 (L) 150 - 400 K/uL   nRBC 0.0 0.0 - 0.2 %   Neutrophils Relative  % 57 %   Neutro Abs 2.5 1.7 - 7.7 K/uL   Lymphocytes Relative 32 %   Lymphs Abs 1.4 0.7 - 4.0 K/uL   Monocytes Relative 8 %   Monocytes Absolute 0.4 0.1 - 1.0 K/uL   Eosinophils Relative 1 %   Eosinophils Absolute 0.1 0.0 - 0.5 K/uL   Basophils Relative 1 %   Basophils Absolute 0.1 0.0 - 0.1 K/uL   WBC Morphology VACUOLATED NEUTROPHILS    Immature Granulocytes 1 %   Abs Immature Granulocytes 0.02 0.00 - 0.07 K/uL   Reactive, Benign Lymphocytes PRESENT     Comment: Performed at [facility], [address] [place], [place] [address]  Protime-INR     Status: None   Collection Time: 08/28/22  8:27 PM  Result Value Ref Range   Prothrombin Time 13.9 11.4 - 15.2 seconds   INR 1.1 0.8 - 1.2    Comment: (NOTE) INR goal varies based on device and disease states. Performed at [facility], [address] [place], [place] [address]   Culture, blood (Routine x 2)     Status: None   Collection Time: 08/28/22  8:27 PM   Specimen: BLOOD  Result Value Ref Range   Specimen Description      BLOOD RIGHT ANTECUBITAL Performed at [facility], [address], [place], [place] [address]    Special Requests      BOTTLES DRAWN AEROBIC AND ANAEROBIC Blood Culture results may not be optimal due to an excessive volume of blood received in culture bottles Performed at [facility], [address] [place], [place] [address]    Culture      NO GROWTH 5 DAYS Performed at [facility], [address], [place], [place] [address]    Report Status 09/03/2022 FINAL   I-Stat Lactic Acid, ED     Status: None   Collection Time: 08/28/22  8:35 PM  Result Value Ref Range   Lactic Acid, Venous 1.0 0.5 - 1.9 mmol/L  Resp panel by RT-PCR (RSV, Flu A&B, Covid)     Status: None   Collection Time: 08/28/22  8:37 PM   Specimen: Nasal Swab  Result Value Ref Range   SARS Coronavirus 2 by RT PCR NEGATIVE NEGATIVE    Comment:  (NOTE) SARS-CoV-2 target nucleic acids are NOT DETECTED.  The SARS-CoV-2 RNA is generally detectable in upper respiratory specimens during the acute phase of infection. The lowest concentration of SARS-CoV-2 viral copies this assay can detect is 138 copies/mL. A negative result does not preclude SARS-Cov-2 infection and should not be used as the sole basis for treatment or other patient management decisions. A negative result may occur with  improper specimen collection/handling, submission of specimen other than nasopharyngeal swab, presence of viral mutation(s) within the areas targeted by this assay, and inadequate number of viral copies(<138 copies/mL). A negative result must be combined with clinical observations, patient history, and epidemiological information. The expected result is Negative.  Fact Sheet for Patients:  BloggerCourse.com  Fact Sheet for Healthcare Providers:  SeriousBroker.it  This test is no t yet approved or cleared by the [place] FDA and  has been authorized for detection and/or diagnosis of SARS-CoV-2 by FDA under an Emergency Use Authorization (EUA). This EUA will remain  in effect (meaning this test can be used) for the duration of the COVID-19 declaration under Section 564(b)(1) of the Act, 21 U.S.C.section 360bbb-3(b)(1), unless the authorization is terminated  or revoked sooner.       Influenza A by PCR NEGATIVE NEGATIVE   Influenza B by PCR NEGATIVE NEGATIVE    Comment: (NOTE) The Xpert Xpress SARS-CoV-2/FLU/RSV plus assay is intended as an aid in the diagnosis of influenza from Nasopharyngeal swab specimens and should not be used as a sole basis for treatment. Nasal washings and aspirates are unacceptable for Xpert Xpress SARS-CoV-2/FLU/RSV testing.  Fact Sheet for Patients: BloggerCourse.com  Fact Sheet for Healthcare  Providers: SeriousBroker.it  This test is not yet approved or cleared by the [place] FDA and has been authorized for detection and/or diagnosis of SARS-CoV-2 by FDA under an Emergency Use Authorization (EUA). This EUA will remain in effect (meaning this test can be used) for the duration of the COVID-19 declaration under Section 564(b)(1) of the Act, 21 U.S.C. section 360bbb-3(b)(1), unless the authorization is terminated or revoked.     Resp Syncytial Virus by PCR NEGATIVE NEGATIVE    Comment: (NOTE) Fact Sheet for Patients: BloggerCourse.com  Fact Sheet for Healthcare Providers: SeriousBroker.it  This test is not yet approved or cleared by the [place] FDA and has been authorized for detection and/or diagnosis of SARS-CoV-2 by FDA under an Emergency Use Authorization (EUA). This EUA will remain in effect (meaning this test can be used) for the duration of the  COVID-19 declaration under Section 564(b)(1) of the Act, 21 U.S.C. section 360bbb-3(b)(1), unless the authorization is terminated or revoked.  Performed at [facility], [address], [place], [place] [address]   Urinalysis, w/ Reflex to Culture (Infection Suspected) -Urine, Clean Catch     Status: Abnormal   Collection Time: 08/28/22  9:18 PM  Result Value Ref Range   Specimen Source URINE, CATHETERIZED    Color, Urine AMBER (A) YELLOW    Comment: BIOCHEMICALS MAY BE AFFECTED BY COLOR   APPearance HAZY (A) CLEAR   Specific Gravity, Urine 1.029 1.005 - 1.030   pH 5.0 5.0 - 8.0   Glucose, UA NEGATIVE NEGATIVE mg/dL   Hgb urine dipstick SMALL (A) NEGATIVE   Bilirubin Urine NEGATIVE NEGATIVE   Ketones, ur 5 (A) NEGATIVE mg/dL   Protein, ur 604 (A) NEGATIVE mg/dL   Nitrite NEGATIVE NEGATIVE   Leukocytes,Ua NEGATIVE NEGATIVE   RBC / HPF 0-5 0 - 5 RBC/hpf   WBC, UA 0-5 0 - 5 WBC/hpf    Comment:        Reflex  urine culture not performed if WBC <=10, OR if Squamous epithelial cells >5. If Squamous epithelial cells >5 suggest recollection.    Bacteria, UA RARE (A) NONE SEEN   Squamous Epithelial / HPF 0-5 0 - 5 /HPF   Mucus PRESENT    Hyaline Casts, UA PRESENT     Comment: Performed at [facility], [address], [place], [place] [address]  Culture, blood (Routine x 2)     Status: None   Collection Time: 08/28/22  9:28 PM   Specimen: BLOOD  Result Value Ref Range   Specimen Description      BLOOD LEFT ANTECUBITAL Performed at [facility], [address], [place], [place] [address]    Special Requests      BOTTLES DRAWN AEROBIC AND ANAEROBIC Blood Culture adequate volume Performed at [facility], [address], [place], [place] [address]    Culture      NO GROWTH 5 DAYS Performed at [facility], [address], [place], [place] [address]    Report Status 09/03/2022 FINAL         Garner Nash, MD, MS

## 2022-09-04 NOTE — ED Provider Notes (Addendum)
3:32 PM Patient signed out to me by previous ED physician. Pt is a 72 yo male presenting from pcp office for tachycardia with hx of fevers and malaise 1-2 weeks ago.   Pending: stable labs. D-dimer elevated. CT PE pending.  Stable ECG and trops x2 Leukocytosis of 13.6 UA positive for small hgb. Some biliruibin. No UTI    Physical Exam  BP 109/67   Pulse 90   Temp 98.9 F (37.2 C) (Oral)   Resp (!) 22   Ht 5\' 8"  (1.727 m)   Wt 105.7 kg   SpO2 97%   BMI 35.43 kg/m   Physical Exam Vitals and nursing note reviewed.  Constitutional:      Appearance: Normal appearance.  Cardiovascular:     Rate and Rhythm: Normal rate.  Pulmonary:     Effort: Pulmonary effort is normal. No tachypnea or respiratory distress.  Neurological:     Mental Status: He is alert.     GCS: GCS eye subscore is 4. GCS verbal subscore is 5. GCS motor subscore is 6.     Procedures  Procedures  ED Course / MDM   Clinical Course as of 09/04/22 1628  Wed Sep 04, 2022  1129 Chest x-ray with elevated right hemidiaphragm no acute changes from prior.  Awaiting radiology reading. [MB]  1615 AST(!): 50 [AG]  1616 Bilirubin Urine(!): MODERATE [AG]  1616 Hgb urine dipstick(!): SMALL [AG]  1619 Creatinine(!): 1.38 [AG]    Clinical Course User Index [AG] Franne Forts, DO [MB] Terrilee Files, MD   Medical Decision Making Amount and/or Complexity of Data Reviewed Labs: ordered. Decision-making details documented in ED Course. Radiology: ordered.  Risk Prescription drug management.   Laboratory studies demonstrating leukocytosis with lympha cytosis.  ECG and troponin stable.  D-dimer elevated.  CT PE demonstrated no pneumonia.  No pulmonary embolism.  Patient also has bilirubin in the urine, ketones, and small hemoglobin.  Patient was seen and approximately within the last 2 weeks at [facility] for flank pain and concerns for a renal stone.  CT renal stone study demonstrated punctate renal  stones only with no ureterolithiasis.  There is a possibility due to the small nature of the stones that the CT imaging did not pick it up and that is why patient continues to have hemoglobin in his urine.  No UTI.  Patient has bilirubin in urine with stable liver profile.  AST moderately elevated.  Patient states he only drinks once a month.  Recommended for close follow-up with PCP.  Ketones in urine concerning for dehydration.  Patient given 500 cc of IV fluids.  States palpitations have completely resolved since fluids.  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  Recommended to decrease heat exposure and increase fluid consumption at this time.  Stable vitals on discharge. Resolution of symptoms. No resp distress.   Patient in no distress and overall condition improved here in the ED. Detailed discussions were had with the patient regarding current findings, and need for close f/u with PCP or on call doctor. The patient has been instructed to return immediately if the symptoms worsen in any way for re-evaluation. Patient verbalized understanding and is in agreement with current care plan. All questions answered prior to discharge.       Franne Forts, DO 09/04/22 1631    Franne Forts, DO 09/04/22 [phone]

## 2022-09-04 NOTE — ED Triage Notes (Signed)
Patient presents to ED via POV from PCP office. Sent due to tachycardia and palpitations. Denies history of same. Denies pain. Endorses nausea. Denies vomiting. Reports shortness of breath and mild dizziness.

## 2022-09-04 NOTE — ED Notes (Signed)
Reviewed discharge instructions and follow up with pt. Pt states understanding. Ambulatory at discharge with family

## 2022-09-04 NOTE — ED Notes (Addendum)
Patient transported to CT. Judgement provided that no accompaniment was needed.

## 2022-09-05 ENCOUNTER — Telehealth: Payer: Self-pay

## 2022-09-05 NOTE — Telephone Encounter (Signed)
Transition Care Management Unsuccessful Follow-up Telephone Call  Date of discharge and from where:  [facility] 8/1  Attempts:  1st Attempt  Reason for unsuccessful TCM follow-up call:  No answer/busy   Lenard Forth [facility] Guide, [facility] [phone] [address] [place], [place], [place] [address] Phone: [phone] Email: [email or web address]

## 2022-09-06 ENCOUNTER — Telehealth: Payer: Self-pay | Admitting: Cardiology

## 2022-09-06 NOTE — Telephone Encounter (Signed)
Advised of MD response. Patient verbalized understanding and agreeable to plan.

## 2022-09-06 NOTE — Telephone Encounter (Signed)
Patient sent message to scheduling for high HR. Below are his answers to the dot phrase questions   STAT if HR is under 50 or over 120 (normal HR is 60-100 beats per minute)  What is your heart rate? 102  Do you have a log of your heart rate readings (document readings)? Lowest is 80 and resting right now is 102  Do you have any other symptoms? I have been to ER (2) times and new Md. All documentation is on My Chart. I have been sick with an infection they could not find, but it has led to dehydration. If Dr. Anne Fu can look at the EKGs on My Chart would be ok . My heart rate currently is 102 resting. The lowest it has been is 80.

## 2022-09-06 NOTE — Telephone Encounter (Signed)
Pt would like Dr. Anne Fu to review recents EKGs done in ER.   He has been fighting a GI infection recently and aware that the infection and/or dehydration may be causing some of his tachycardia.   However: Per PCP note from 8/7: Assessment: Patient presents with persistent sinus tachycardia, frequent multiform ectopic ventricular beats, progressive nonspecific ST depression, and nonspecific T wave abnormalities, with a history of significant cardiac CAD and multiple arterial blockages.  Concern for secondary GI infection from persistent diarrhea likely from recent Augmentin use.   Plan: Due to the concern for possible cardiac strain or NSTEMI related to ongoing infection, immediate referral to the emergency department is recommended.    Will forward to MD for review/advisement. Aware office will call him once MD reviews. Patient verbalized understanding and agreeable to plan.

## 2022-09-19 ENCOUNTER — Encounter: Payer: Self-pay | Admitting: Family Medicine

## 2022-09-19 ENCOUNTER — Ambulatory Visit: Payer: Medicare PPO | Admitting: Family Medicine

## 2022-09-19 VITALS — BP 128/84 | HR 78 | Temp 97.8°F | Wt 228.0 lb

## 2022-09-19 DIAGNOSIS — M25561 Pain in right knee: Secondary | ICD-10-CM | POA: Diagnosis not present

## 2022-09-19 DIAGNOSIS — G8929 Other chronic pain: Secondary | ICD-10-CM | POA: Diagnosis not present

## 2022-09-19 DIAGNOSIS — K449 Diaphragmatic hernia without obstruction or gangrene: Secondary | ICD-10-CM

## 2022-09-19 DIAGNOSIS — N401 Enlarged prostate with lower urinary tract symptoms: Secondary | ICD-10-CM | POA: Diagnosis not present

## 2022-09-19 DIAGNOSIS — R351 Nocturia: Secondary | ICD-10-CM | POA: Diagnosis not present

## 2022-09-19 DIAGNOSIS — M545 Low back pain, unspecified: Secondary | ICD-10-CM

## 2022-09-19 DIAGNOSIS — K429 Umbilical hernia without obstruction or gangrene: Secondary | ICD-10-CM

## 2022-09-19 DIAGNOSIS — N200 Calculus of kidney: Secondary | ICD-10-CM | POA: Diagnosis not present

## 2022-09-19 MED ORDER — ALFUZOSIN HCL ER 10 MG PO TB24
10.0000 mg | ORAL_TABLET | Freq: Every day | ORAL | 0 refills | Status: DC
Start: 2022-09-19 — End: 2022-10-02

## 2022-09-19 NOTE — Progress Notes (Unsigned)
Assessment/Plan:   Problem List Items Addressed This Visit   None Visit Diagnoses     Benign prostatic hyperplasia with nocturia    -  Primary   Relevant Medications   alfuzosin (UROXATRAL) 10 MG 24 hr tablet   Other Relevant Orders   PSA   Ambulatory referral to Urology   Nephrolithiasis       Relevant Medications   alfuzosin (UROXATRAL) 10 MG 24 hr tablet   Hiatal hernia       Relevant Orders   Ambulatory referral to Gastroenterology   Umbilical hernia without obstruction and without gangrene       Relevant Orders   Ambulatory referral to General Surgery       There are no discontinued medications.  Return in about 6 months (around 03/22/2023) for HLD, fasting labs.    Subjective:   Encounter date: 09/19/2022  Martin Norris is a 72 y.o. male who has Hallux limitus of right foot; Healthcare maintenance; Hyperlipidemia; CAD (coronary artery disease), native coronary artery; Angina pectoris (HCC); Statin intolerance; Pain in right knee; Synovial cyst of right popliteal space; and Osteoarthritis of knee on their problem list..   He  has a past medical history of Arthritis, Blockage of coronary artery of heart (HCC), Chronic kidney disease, Dizziness, GERD (gastroesophageal reflux disease), Hyperlipidemia, Migraines, and Vertigo..   Chief Complaint:  Follow-up for dehydration, back pain, and management of hernias and prostate enlargement.  History of Present Illness:  Dehydration and Cardiac Incident: The patient, Martin Norris, was previously sent to the ER due to concerns of dehydration and potential cardiac issues. The patient reports feeling well currently, attributing past symptoms to dehydration, likely exacerbated by Augmentin-induced diarrhea and a recent infection that caused fever and night sweats. The patient also mentions having provided urine samples after six hours and receiving IV fluids, which significantly improved his condition.  Back Pain: The patient  reports intermittent back pain that may be associated with gastrointestinal issues or potential kidney stones.  Prostate Enlargement: The patient was previously diagnosed with slight enlargement of the prostate observed on a CT scan. He had a routine annual check-up to monitor this, but reports frequent nocturia, urinating 3-4 times per night, with a sense of incomplete bladder emptying.  Hernias: The patient has a history of hiatal hernia and umbilical hernia. Discussions about potential surgical interventions took place, with the patient favoring less invasive endoscopic procedures if possible.  Additional Concerns: The patient reported a knee issue prepared for fluid drainage and corticosteroid injection but postponed due to recent illness.  ROS  Past Surgical History:  Procedure Laterality Date   EYE SURGERY     KNEE ARTHROSCOPY Left    LASIK Bilateral 2000   LEFT HEART CATH AND CORONARY ANGIOGRAPHY N/A 11/23/2019   Procedure: LEFT HEART CATH AND CORONARY ANGIOGRAPHY;  Surgeon: Lyn Records, MD;  Location: [facility];  Service: Cardiovascular;  Laterality: N/A;    Outpatient Medications Prior to Visit  Medication Sig Dispense Refill   aspirin EC 81 MG tablet Take 1 tablet (81 mg total) by mouth daily. Swallow whole. (Patient taking differently: Take 81 mg by mouth every evening. Swallow whole.)     rosuvastatin (CRESTOR) 10 MG tablet Take 1 tablet (10 mg total) by mouth daily. 90 tablet 3   acetaminophen (TYLENOL) 500 MG tablet Take 1 tablet (500 mg total) by mouth every 6 (six) hours as needed. (Patient not taking: Reported on 09/19/2022) 90 tablet 0   Ibuprofen (ADVIL  PO) Take 200 mg by mouth 3 (three) times daily as needed (pain). Duo (Patient not taking: Reported on 09/19/2022)     No facility-administered medications prior to visit.    Family History  Problem Relation Age of Onset   Healthy Mother    Macular degeneration Mother    Arthritis Mother    Heart  disease Father    Stroke Father    Hyperlipidemia Father    COPD Sister    Hypertension Brother    Hyperlipidemia Brother    Hypertension Sister    Hyperlipidemia Brother    Parkinson's disease Paternal Grandfather    Stroke Paternal Grandfather    Heart failure Paternal Grandfather    Colon cancer Neg Hx    Esophageal cancer Neg Hx    Rectal cancer Neg Hx    Stomach cancer Neg Hx     Social History   Socioeconomic History   Marital status: Married    Spouse name: [name]   Number of children: 2   Years of education: Not on file   Highest education level: GED or equivalent  Occupational History    Comment: retired Materials engineer maintenance  Tobacco Use   Smoking status: Former    Current packs/day: 0.00    Average packs/day: 2.0 packs/day for 30.0 years (60.0 ttl pk-yrs)    Types: Cigarettes    Start date: 01/29/1959    Quit date: 01/28/1989    Years since quitting: 33.6   Smokeless tobacco: Never  Vaping Use   Vaping status: Never Used  Substance and Sexual Activity   Alcohol use: Yes    Alcohol/week: 2.0 standard drinks of alcohol    Types: 2 Cans of beer per week    Comment: weekends   Drug use: Not Currently    Types: Marijuana    Comment: Quit 4 years ago   Sexual activity: Not Currently    Birth control/protection: None  Other Topics Concern   Not on file  Social History Narrative   Lives with wife   Caffeine- coffee 1 c, Coke 1 daily, tea 2-3 daily   Social Determinants of Health   Financial Resource Strain: Low Risk  (09/17/2022)   Overall Financial Resource Strain (CARDIA)    Difficulty of Paying Living Expenses: Not hard at all  Food Insecurity: No Food Insecurity (09/17/2022)   Hunger Vital Sign    Worried About Running Out of Food in the Last Year: Never true    Ran Out of Food in the Last Year: Never true  Transportation Needs: No Transportation Needs (09/17/2022)   PRAPARE - Administrator, Civil Service (Medical): No    Lack of  Transportation (Non-Medical): No  Physical Activity: Insufficiently Active (09/17/2022)   Exercise Vital Sign    Days of Exercise per Week: 2 days    Minutes of Exercise per Session: 30 min  Stress: No Stress Concern Present (09/17/2022)   [employer] of Occupational Health - Occupational Stress Questionnaire    Feeling of Stress : Not at all  Social Connections: Unknown (09/17/2022)   Social Connection and Isolation Panel [NHANES]    Frequency of Communication with Friends and Family: More than three times a week    Frequency of Social Gatherings with Friends and Family: More than three times a week    Attends Religious Services: Patient declined    Database administrator or Organizations: No    Attends Banker Meetings: Not on file    Marital  Status: Married  Catering manager Violence: Not At Risk (04/11/2021)   Humiliation, Afraid, Rape, and Kick questionnaire    Fear of Current or Ex-Partner: No    Emotionally Abused: No    Physically Abused: No    Sexually Abused: No                                                                                                  Objective:  Physical Exam: BP 128/84 (BP Location: Left Arm, Patient Position: Sitting, Cuff Size: Large)   Pulse 78   Temp 97.8 F (36.6 C) (Temporal)   Wt 228 lb (103.4 kg)   SpO2 99%   BMI 34.67 kg/m     Physical Exam  CT Angio Chest PE W/Cm &/Or Wo Cm  Result Date: 09/04/2022 CLINICAL DATA:  Palpitations and tachycardia EXAM: CT ANGIOGRAPHY CHEST WITH CONTRAST TECHNIQUE: Multidetector CT imaging of the chest was performed using the standard protocol during bolus administration of intravenous contrast. Multiplanar CT image reconstructions and MIPs were obtained to evaluate the vascular anatomy. RADIATION DOSE REDUCTION: This exam was performed according to the departmental dose-optimization program which includes automated exposure control, adjustment of the mA and/or kV according to patient  size and/or use of iterative reconstruction technique. CONTRAST:  OMNIPAQUE IOHEXOL 350 MG/ML SOLN COMPARISON:  Chest radiograph dated 09/04/2022, cardiac CT dated 11/11/2019 FINDINGS: Cardiovascular: The study is high quality for the evaluation of pulmonary embolism. There are no filling defects in the central, lobar, segmental or subsegmental pulmonary artery branches to suggest acute pulmonary embolism. Great vessels are normal in course and caliber. Normal heart size. No significant pericardial fluid/thickening. Coronary artery calcifications and aortic atherosclerosis. Mediastinum/Nodes: Imaged thyroid gland without nodules meeting criteria for imaging follow-up by size. Normal esophagus. Right hilar lymph node measures 1.3 cm (6:38), previously 1.0 cm. Lungs/Pleura: The central airways are patent. Unchanged elevation of the right hemidiaphragm with subsegmental atelectasis of the right middle and lower lobes. Subpleural right upper lobe nodule measures 6 x 4 mm (8:22). No pneumothorax. No pleural effusion. Upper abdomen: Partially imaged right renal lower pole simple cyst. No specific follow-up imaging recommended. Mildly enlarged spleen measures 14.5 cm, previously 13.4 cm. Musculoskeletal: No acute or abnormal lytic or blastic osseous lesions. Review of the MIP images confirms the above findings. IMPRESSION: 1. No evidence of pulmonary embolism. 2. Unchanged elevation of the right hemidiaphragm with subsegmental atelectasis of the right middle and lower lobes. 3. Subpleural right upper lobe nodule measures 5 mm. No follow-up needed if patient is low-risk.This recommendation follows the consensus statement: Guidelines for Management of Incidental Pulmonary Nodules Detected on CT Images: From the Fleischner Society 2017; Radiology 2017; 284:228-243. 4. Mildly enlarged right hilar lymph node measures 1.3 cm, previously 1.0 cm, indeterminate. 5. Mildly increased splenomegaly. 6. Aortic Atherosclerosis  (ICD10-I70.0). Coronary artery calcifications. Assessment for potential risk factor modification, dietary therapy or pharmacologic therapy may be warranted, if clinically indicated. Electronically Signed   By: Agustin Cree M.D.   On: 09/04/2022 15:36   DG Chest Portable 1 View  Result Date: 09/04/2022 CLINICAL DATA:  Palpitations EXAM:  PORTABLE CHEST 1 VIEW COMPARISON:  X-ray 08/28/2022 FINDINGS: Elevated right hemidiaphragm. No consolidation, pneumothorax or effusion. No edema. Normal cardiopericardial silhouette. Films are under penetrated. IMPRESSION: Elevated right hemidiaphragm.  No acute cardiopulmonary disease Electronically Signed   By: Karen Kays M.D.   On: 09/04/2022 11:59   CT Renal Stone Study  Result Date: 08/28/2022 CLINICAL DATA:  Abdominal pain.  Concern for kidney stone. EXAM: CT ABDOMEN AND PELVIS WITHOUT CONTRAST TECHNIQUE: Multidetector CT imaging of the abdomen and pelvis was performed following the standard protocol without IV contrast. RADIATION DOSE REDUCTION: This exam was performed according to the departmental dose-optimization program which includes automated exposure control, adjustment of the mA and/or kV according to patient size and/or use of iterative reconstruction technique. COMPARISON:  None Available. FINDINGS: Evaluation of this exam is limited in the absence of intravenous contrast. Lower chest: There is mild eventration of the right hemidiaphragm with mild right lung base compressive atelectasis. The visualized left lungs are clear. There is 3 vessel coronary vascular calcification. No intra-abdominal free air or free fluid. Hepatobiliary: The liver is upper. No biliary dilatation. The gallbladder is unremarkable. Pancreas: Unremarkable. No pancreatic ductal dilatation or surrounding inflammatory changes. Spleen: Normal in size without focal abnormality. Adrenals/Urinary Tract: The adrenal glands are unremarkable. Vascular calcification versus a punctate nonobstructing  right renal inferior pole calculus. No hydronephrosis. There is a 4.7 cm cyst in the inferior pole of the right kidney. No follow-up imaging. There is no hydronephrosis or nephrolithiasis on the left. The visualized ureters and urinary bladder appear unremarkable. Stomach/Bowel: There is sigmoid diverticulosis without active inflammatory changes. There is no bowel obstruction or active inflammation. The appendix is normal. Vascular/Lymphatic: Mild aortoiliac atherosclerotic disease. The IVC is unremarkable. No portal venous gas. There is no adenopathy. Reproductive: Mildly enlarged prostate gland measuring 4.5 cm in transverse axial diameter. The seminal vesicles are symmetric. Other: None Musculoskeletal: No acute osseous pathology. IMPRESSION: 1. No acute intra-abdominal or pelvic pathology. 2. Punctate nonobstructing right renal inferior pole calculus no hydronephrosis or obstructing calculus. 3. Sigmoid diverticulosis. No bowel obstruction. Normal appendix. 4.  Aortic Atherosclerosis (ICD10-I70.0). Electronically Signed   By: Elgie Collard M.D.   On: 08/28/2022 23:39   DG Chest 2 View  Result Date: 08/28/2022 CLINICAL DATA:  Suspected sepsis. EXAM: CHEST - 2 VIEW COMPARISON:  CT chest 11/11/2019 FINDINGS: There is stable elevation of the RIGHT hemidiaphragm. Heart size is normal. Lungs are clear. There is no pulmonary edema. IMPRESSION: No active cardiopulmonary disease. Electronically Signed   By: Norva Pavlov M.D.   On: 08/28/2022 21:47    Recent Results (from the past 2160 hour(s))  Comprehensive metabolic panel     Status: Abnormal   Collection Time: 08/28/22  8:27 PM  Result Value Ref Range   Sodium 135 135 - 145 mmol/L   Potassium 3.8 3.5 - 5.1 mmol/L   Chloride 100 98 - 111 mmol/L   CO2 26 22 - 32 mmol/L   Glucose, Bld 114 (H) 70 - 99 mg/dL    Comment: Glucose reference range applies only to samples taken after fasting for at least 8 hours.   BUN 25 (H) 8 - 23 mg/dL   Creatinine,  Ser 9.56 (H) 0.61 - 1.24 mg/dL   Calcium 8.4 (L) 8.9 - 10.3 mg/dL   Total Protein 7.0 6.5 - 8.1 g/dL   Albumin 4.1 3.5 - 5.0 g/dL   AST 41 15 - 41 U/L   ALT 34 0 - 44 U/L   Alkaline  Phosphatase 65 38 - 126 U/L   Total Bilirubin 0.9 0.3 - 1.2 mg/dL   GFR, Estimated >44 >01 mL/min    Comment: (NOTE) Calculated using the CKD-EPI Creatinine Equation (2021)    Anion gap 9 5 - 15    Comment: Performed at [facility], [address], [place], [place] [address]  CBC with Differential     Status: Abnormal   Collection Time: 08/28/22  8:27 PM  Result Value Ref Range   WBC 4.4 4.0 - 10.5 K/uL   RBC 5.13 4.22 - 5.81 MIL/uL   Hemoglobin 15.4 13.0 - 17.0 g/dL   HCT 36.6 44.0 - 34.7 %   MCV 91.4 80.0 - 100.0 fL   MCH 30.0 26.0 - 34.0 pg   MCHC 32.8 30.0 - 36.0 g/dL   RDW 42.5 95.6 - 38.7 %   Platelets 146 (L) 150 - 400 K/uL   nRBC 0.0 0.0 - 0.2 %   Neutrophils Relative % 57 %   Neutro Abs 2.5 1.7 - 7.7 K/uL   Lymphocytes Relative 32 %   Lymphs Abs 1.4 0.7 - 4.0 K/uL   Monocytes Relative 8 %   Monocytes Absolute 0.4 0.1 - 1.0 K/uL   Eosinophils Relative 1 %   Eosinophils Absolute 0.1 0.0 - 0.5 K/uL   Basophils Relative 1 %   Basophils Absolute 0.1 0.0 - 0.1 K/uL   WBC Morphology VACUOLATED NEUTROPHILS    Immature Granulocytes 1 %   Abs Immature Granulocytes 0.02 0.00 - 0.07 K/uL   Reactive, Benign Lymphocytes PRESENT     Comment: Performed at [facility], [address] [place], [place] [address]  Protime-INR     Status: None   Collection Time: 08/28/22  8:27 PM  Result Value Ref Range   Prothrombin Time 13.9 11.4 - 15.2 seconds   INR 1.1 0.8 - 1.2    Comment: (NOTE) INR goal varies based on device and disease states. Performed at [facility], [address] [place], [place] [address]   Culture, blood (Routine x 2)     Status: None   Collection Time: 08/28/22  8:27 PM   Specimen: BLOOD  Result Value Ref Range    Specimen Description      BLOOD RIGHT ANTECUBITAL Performed at [facility], [address] [place], [place] [address]    Special Requests      BOTTLES DRAWN AEROBIC AND ANAEROBIC Blood Culture results may not be optimal due to an excessive volume of blood received in culture bottles Performed at [facility], [address], [place], [place] [address]    Culture      NO GROWTH 5 DAYS Performed at [facility], [address], [place], [place] [address]    Report Status 09/03/2022 FINAL   I-Stat Lactic Acid, ED     Status: None   Collection Time: 08/28/22  8:35 PM  Result Value Ref Range   Lactic Acid, Venous 1.0 0.5 - 1.9 mmol/L  Resp panel by RT-PCR (RSV, Flu A&B, Covid)     Status: None   Collection Time: 08/28/22  8:37 PM   Specimen: Nasal Swab  Result Value Ref Range   SARS Coronavirus 2 by RT PCR NEGATIVE NEGATIVE    Comment: (NOTE) SARS-CoV-2 target nucleic acids are NOT DETECTED.  The SARS-CoV-2 RNA is generally detectable in upper respiratory specimens during the acute phase of infection. The lowest concentration of SARS-CoV-2 viral copies  this assay can detect is 138 copies/mL. A negative result does not preclude SARS-Cov-2 infection and should not be used as the sole basis for treatment or other patient management decisions. A negative result may occur with  improper specimen collection/handling, submission of specimen other than nasopharyngeal swab, presence of viral mutation(s) within the areas targeted by this assay, and inadequate number of viral copies(<138 copies/mL). A negative result must be combined with clinical observations, patient history, and epidemiological information. The expected result is Negative.  Fact Sheet for Patients:  BloggerCourse.com  Fact Sheet for Healthcare Providers:  SeriousBroker.it  This test is no t yet approved or cleared by the  [place] FDA and  has been authorized for detection and/or diagnosis of SARS-CoV-2 by FDA under an Emergency Use Authorization (EUA). This EUA will remain  in effect (meaning this test can be used) for the duration of the COVID-19 declaration under Section 564(b)(1) of the Act, 21 U.S.C.section 360bbb-3(b)(1), unless the authorization is terminated  or revoked sooner.       Influenza A by PCR NEGATIVE NEGATIVE   Influenza B by PCR NEGATIVE NEGATIVE    Comment: (NOTE) The Xpert Xpress SARS-CoV-2/FLU/RSV plus assay is intended as an aid in the diagnosis of influenza from Nasopharyngeal swab specimens and should not be used as a sole basis for treatment. Nasal washings and aspirates are unacceptable for Xpert Xpress SARS-CoV-2/FLU/RSV testing.  Fact Sheet for Patients: BloggerCourse.com  Fact Sheet for Healthcare Providers: SeriousBroker.it  This test is not yet approved or cleared by the [place] FDA and has been authorized for detection and/or diagnosis of SARS-CoV-2 by FDA under an Emergency Use Authorization (EUA). This EUA will remain in effect (meaning this test can be used) for the duration of the COVID-19 declaration under Section 564(b)(1) of the Act, 21 U.S.C. section 360bbb-3(b)(1), unless the authorization is terminated or revoked.     Resp Syncytial Virus by PCR NEGATIVE NEGATIVE    Comment: (NOTE) Fact Sheet for Patients: BloggerCourse.com  Fact Sheet for Healthcare Providers: SeriousBroker.it  This test is not yet approved or cleared by the [place] FDA and has been authorized for detection and/or diagnosis of SARS-CoV-2 by FDA under an Emergency Use Authorization (EUA). This EUA will remain in effect (meaning this test can be used) for the duration of the COVID-19 declaration under Section 564(b)(1) of the Act, 21 U.S.C. section 360bbb-3(b)(1),  unless the authorization is terminated or revoked.  Performed at [facility], [address] [place], [place] [address]   Urinalysis, w/ Reflex to Culture (Infection Suspected) -Urine, Clean Catch     Status: Abnormal   Collection Time: 08/28/22  9:18 PM  Result Value Ref Range   Specimen Source URINE, CATHETERIZED    Color, Urine AMBER (A) YELLOW    Comment: BIOCHEMICALS MAY BE AFFECTED BY COLOR   APPearance HAZY (A) CLEAR   Specific Gravity, Urine 1.029 1.005 - 1.030   pH 5.0 5.0 - 8.0   Glucose, UA NEGATIVE NEGATIVE mg/dL   Hgb urine dipstick SMALL (A) NEGATIVE   Bilirubin Urine NEGATIVE NEGATIVE   Ketones, ur 5 (A) NEGATIVE mg/dL   Protein, ur 604 (A) NEGATIVE mg/dL   Nitrite NEGATIVE NEGATIVE   Leukocytes,Ua NEGATIVE NEGATIVE   RBC / HPF 0-5 0 - 5 RBC/hpf   WBC, UA 0-5 0 - 5 WBC/hpf    Comment:        Reflex urine culture not performed if WBC <=10, OR if Squamous epithelial cells >5.  If Squamous epithelial cells >5 suggest recollection.    Bacteria, UA RARE (A) NONE SEEN   Squamous Epithelial / HPF 0-5 0 - 5 /HPF   Mucus PRESENT    Hyaline Casts, UA PRESENT     Comment: Performed at [facility], [address], [place], [place] [address]  Culture, blood (Routine x 2)     Status: None   Collection Time: 08/28/22  9:28 PM   Specimen: BLOOD  Result Value Ref Range   Specimen Description      BLOOD LEFT ANTECUBITAL Performed at [facility], [address] [place], [place] [address]    Special Requests      BOTTLES DRAWN AEROBIC AND ANAEROBIC Blood Culture adequate volume Performed at [facility], [address], [place], [place] [address]    Culture      NO GROWTH 5 DAYS Performed at [facility], [address] [place], [place] [address]    Report Status 09/03/2022 FINAL   CBC with Differential     Status: Abnormal   Collection Time: 09/04/22 10:46 AM  Result Value Ref  Range   WBC 13.6 (H) 4.0 - 10.5 K/uL   RBC 4.85 4.22 - 5.81 MIL/uL   Hemoglobin 14.6 13.0 - 17.0 g/dL   HCT 65.7 84.6 - 96.2 %   MCV 89.1 80.0 - 100.0 fL   MCH 30.1 26.0 - 34.0 pg   MCHC 33.8 30.0 - 36.0 g/dL   RDW 95.2 84.1 - 32.4 %   Platelets 257 150 - 400 K/uL   nRBC 0.0 0.0 - 0.2 %   Neutrophils Relative % 31 %   Neutro Abs 4.1 1.7 - 7.7 K/uL   Lymphocytes Relative 63 %   Lymphs Abs 8.5 (H) 0.7 - 4.0 K/uL   Monocytes Relative 5 %   Monocytes Absolute 0.7 0.1 - 1.0 K/uL   Eosinophils Relative 0 %   Eosinophils Absolute 0.0 0.0 - 0.5 K/uL   Basophils Relative 1 %   Basophils Absolute 0.2 (H) 0.0 - 0.1 K/uL   RBC Morphology MORPHOLOGY UNREMARKABLE    Smear Review MORPHOLOGY UNREMARKABLE    Immature Granulocytes 0 %   Abs Immature Granulocytes 0.06 0.00 - 0.07 K/uL   Reactive, Benign Lymphocytes PRESENT     Comment: Performed at [facility], [address] [place], [place] [address]  Comprehensive metabolic panel     Status: Abnormal   Collection Time: 09/04/22 10:46 AM  Result Value Ref Range   Sodium 131 (L) 135 - 145 mmol/L   Potassium 4.2 3.5 - 5.1 mmol/L   Chloride 94 (L) 98 - 111 mmol/L   CO2 24 22 - 32 mmol/L   Glucose, Bld 124 (H) 70 - 99 mg/dL    Comment: Glucose reference range applies only to samples taken after fasting for at least 8 hours.   BUN 20 8 - 23 mg/dL   Creatinine, Ser 7.25 (H) 0.61 - 1.24 mg/dL   Calcium 8.2 (L) 8.9 - 10.3 mg/dL   Total Protein 6.0 (L) 6.5 - 8.1 g/dL   Albumin 3.1 (L) 3.5 - 5.0 g/dL   AST 50 (H) 15 - 41 U/L   ALT 39 0 - 44 U/L   Alkaline Phosphatase 46 38 - 126 U/L   Total Bilirubin 0.9 0.3 - 1.2 mg/dL   GFR, Estimated 55 (L) >60 mL/min    Comment: (NOTE) Calculated using the CKD-EPI Creatinine Equation (2021)  Anion gap 13 5 - 15    Comment: Performed at [facility], [address] [place], [place] [address]  Lactic acid, plasma     Status: None   Collection Time: 09/04/22 11:10 AM  Result  Value Ref Range   Lactic Acid, Venous 1.6 0.5 - 1.9 mmol/L    Comment: Performed at [facility], [address], [place], [place] [address]  Lipase, blood     Status: None   Collection Time: 09/04/22 11:10 AM  Result Value Ref Range   Lipase 33 11 - 51 U/L    Comment: Performed at [facility], [address], [place], [place] [address]  Magnesium     Status: None   Collection Time: 09/04/22 11:10 AM  Result Value Ref Range   Magnesium 1.8 1.7 - 2.4 mg/dL    Comment: Performed at [facility], [address], [place], [place] [address]  SARS Coronavirus 2 by RT PCR (hospital order, performed in [facility] hospital lab) *cepheid single result test*     Status: None   Collection Time: 09/04/22 11:10 AM   Specimen: Nasal Swab  Result Value Ref Range   SARS Coronavirus 2 by RT PCR NEGATIVE NEGATIVE    Comment: (NOTE) SARS-CoV-2 target nucleic acids are NOT DETECTED.  The SARS-CoV-2 RNA is generally detectable in upper and lower respiratory specimens during the acute phase of infection. The lowest concentration of SARS-CoV-2 viral copies this assay can detect is 250 copies / mL. A negative result does not preclude SARS-CoV-2 infection and should not be used as the sole basis for treatment or other patient management decisions.  A negative result may occur with improper specimen collection / handling, submission of specimen other than nasopharyngeal swab, presence of viral mutation(s) within the areas targeted by this assay, and inadequate number of viral copies (<250 copies / mL). A negative result must be combined with clinical observations, patient history, and epidemiological information.  Fact Sheet for Patients:   RoadLapTop.co.za  Fact Sheet for Healthcare Providers: http://kim-miller.com/  This test is not yet approved or  cleared by the [place] FDA and has been  authorized for detection and/or diagnosis of SARS-CoV-2 by FDA under an Emergency Use Authorization (EUA).  This EUA will remain in effect (meaning this test can be used) for the duration of the COVID-19 declaration under Section 564(b)(1) of the Act, 21 U.S.C. section 360bbb-3(b)(1), unless the authorization is terminated or revoked sooner.  Performed at [facility], [address] [place], [place] [address]   Troponin I (High Sensitivity)     Status: None   Collection Time: 09/04/22 11:10 AM  Result Value Ref Range   Troponin I (High Sensitivity) 15 <18 ng/L    Comment: (NOTE) Elevated high sensitivity troponin I (hsTnI) values and significant  changes across serial measurements may suggest ACS but many other  chronic and acute conditions are known to elevate hsTnI results.  Refer to the "Links" section for chest pain algorithms and additional  guidance. Performed at [facility], [address] [place], [place] [address]   Culture, blood (routine x 2)     Status: None   Collection Time: 09/04/22 11:18 AM   Specimen: BLOOD  Result Value Ref Range   Specimen Description      BLOOD RIGHT ANTECUBITAL Performed at [facility], [address] [place], [place] [address]    Special Requests  BOTTLES DRAWN AEROBIC AND ANAEROBIC Blood Culture adequate volume Performed at [facility], [address] [place], [place] [address]    Culture      NO GROWTH 5 DAYS Performed at [facility], [address] [place], [place] [address]    Report Status 09/09/2022 FINAL   Culture, blood (routine x 2)     Status: None   Collection Time: 09/04/22 11:18 AM   Specimen: BLOOD  Result Value Ref Range   Specimen Description      BLOOD LEFT ANTECUBITAL Performed at [facility], [address] [place], [place] [address]    Special Requests      BOTTLES DRAWN AEROBIC AND ANAEROBIC Blood Culture adequate volume Performed  at [facility], [address] [place], [place] [address]    Culture      NO GROWTH 5 DAYS Performed at [facility], [address] [place], [place] [address]    Report Status 09/09/2022 FINAL   Troponin I (High Sensitivity)     Status: None   Collection Time: 09/04/22  1:19 PM  Result Value Ref Range   Troponin I (High Sensitivity) 14 <18 ng/L    Comment: (NOTE) Elevated high sensitivity troponin I (hsTnI) values and significant  changes across serial measurements may suggest ACS but many other  chronic and acute conditions are known to elevate hsTnI results.  Refer to the "Links" section for chest pain algorithms and additional  guidance. Performed at [facility], [address] [place], [place] [address]   D-dimer, quantitative     Status: Abnormal   Collection Time: 09/04/22  1:19 PM  Result Value Ref Range   D-Dimer, Quant 2.72 (H) 0.00 - 0.50 ug/mL-FEU    Comment: (NOTE) At the manufacturer cut-off value of 0.5 g/mL FEU, this assay has a negative predictive value of 95-100%.This assay is intended for use in conjunction with a clinical pretest probability (PTP) assessment model to exclude pulmonary embolism (PE) and deep venous thrombosis (DVT) in outpatients suspected of PE or DVT. Results should be correlated with clinical presentation. Performed at [facility], [address] [place], [place] [address]   Urinalysis, Routine w reflex microscopic -Urine, Clean Catch     Status: Abnormal   Collection Time: 09/04/22  2:22 PM  Result Value Ref Range   Color, Urine AMBER (A) YELLOW    Comment: BIOCHEMICALS MAY BE AFFECTED BY COLOR   APPearance HAZY (A) CLEAR   Specific Gravity, Urine >=1.030 1.005 - 1.030   pH 5.5 5.0 - 8.0   Glucose, UA NEGATIVE NEGATIVE mg/dL   Hgb urine dipstick SMALL (A) NEGATIVE   Bilirubin Urine MODERATE (A) NEGATIVE   Ketones, ur 15 (A) NEGATIVE mg/dL   Protein, ur >=962 (A) NEGATIVE mg/dL    Nitrite NEGATIVE NEGATIVE   Leukocytes,Ua NEGATIVE NEGATIVE    Comment: Performed at [facility], [address] [place], [place] [address]  Urinalysis, Microscopic (reflex)     Status: Abnormal   Collection Time: 09/04/22  2:22 PM  Result Value Ref Range   RBC / HPF 0-5 0 - 5 RBC/hpf   WBC, UA 0-5 0 - 5 WBC/hpf   Bacteria, UA FEW (A) NONE SEEN   Squamous Epithelial / HPF 0-5 0 - 5 /HPF   Mucus PRESENT    Hyaline Casts, UA PRESENT     Comment: Performed at [facility] [address] [place], [place] [address]        Garner Nash, MD, MS

## 2022-09-22 DIAGNOSIS — K429 Umbilical hernia without obstruction or gangrene: Secondary | ICD-10-CM | POA: Insufficient documentation

## 2022-09-22 DIAGNOSIS — N200 Calculus of kidney: Secondary | ICD-10-CM | POA: Insufficient documentation

## 2022-09-22 DIAGNOSIS — M545 Low back pain, unspecified: Secondary | ICD-10-CM | POA: Insufficient documentation

## 2022-09-22 DIAGNOSIS — K449 Diaphragmatic hernia without obstruction or gangrene: Secondary | ICD-10-CM | POA: Insufficient documentation

## 2022-09-22 DIAGNOSIS — N401 Enlarged prostate with lower urinary tract symptoms: Secondary | ICD-10-CM | POA: Insufficient documentation

## 2022-09-22 NOTE — Assessment & Plan Note (Signed)
light prostate enlargement observed, causing nocturia.  Plan: Initiate Alfazosin 10 mg daily. Schedule PSA testing. Ambulatory referral to Urology for further evaluation.

## 2022-09-22 NOTE — Assessment & Plan Note (Signed)
Past history and recent imaging confirming stones.  Plan: Stay well-hydrated to prevent stone formation. Pain management as needed. Ambulatory referral to Urology for assessment and possible lithotripsy if stones cause symptoms.

## 2022-09-22 NOTE — Assessment & Plan Note (Signed)
Intermittent pain, likely secondary to GI issues, less likely kidney stones.  Plan: Monitor symptoms. Consider follow-up imaging if pain persists or worsens.

## 2022-09-22 NOTE — Assessment & Plan Note (Addendum)
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

## 2022-09-22 NOTE — Assessment & Plan Note (Signed)
Hiatal and umbilical hernias causing intermittent discomfort.  Plan: Ambulatory referral to Gastroenterology for endoscopic evaluation of hiatal hernia. Ambulatory referral to General Surgery for umbilical hernia repair. Discuss potential combined surgical approach if feasible.

## 2022-10-02 ENCOUNTER — Encounter: Payer: Self-pay | Admitting: Urology

## 2022-10-02 ENCOUNTER — Ambulatory Visit: Payer: Medicare PPO | Admitting: Urology

## 2022-10-02 VITALS — BP 110/73 | HR 77 | Ht 68.0 in | Wt 228.0 lb

## 2022-10-02 DIAGNOSIS — N200 Calculus of kidney: Secondary | ICD-10-CM | POA: Diagnosis not present

## 2022-10-02 DIAGNOSIS — N401 Enlarged prostate with lower urinary tract symptoms: Secondary | ICD-10-CM | POA: Diagnosis not present

## 2022-10-02 DIAGNOSIS — R351 Nocturia: Secondary | ICD-10-CM | POA: Diagnosis not present

## 2022-10-02 MED ORDER — ALFUZOSIN HCL ER 10 MG PO TB24
10.0000 mg | ORAL_TABLET | Freq: Every day | ORAL | 3 refills | Status: AC
Start: 2022-10-02 — End: 2023-09-27

## 2022-10-02 NOTE — Progress Notes (Signed)
Assessment: 1. Benign prostatic hyperplasia with nocturia   2. Nephrolithiasis      Plan: Discussed insignificant punctate right lower pole renal calculus. Also discussed his BPH/lower urinary tract symptoms.  He has had a great response to initiation of an alpha-blocker. I have recommended that he stay on the Uroxatrol and updated his prescription with refills. Patient will follow-up with PSA testing as ordered by Dr. Janee Morn.  Patient will follow-up here in 6 months for recheck with symptom assessment and residual urine determination.  Chief Complaint: LUTS  History of Present Illness:  Martin Norris is a 72 y.o. male who is seen in consultation from Garnette Gunner, MD for evaluation of lower urinary tract symptoms and nephrolithiasis. Patient was recently found to have a tiny lower pole right renal calculus which is of no clinical significance. Patient also recently reported slowly progressive lower urinary tract symptoms with the most bothersome being significant nocturia of up to 6-8 times at night.  He was started on Uroxatrol by his PCP and has had a traumatic improvement in his lower urinary tract symptoms. Current IPSS = 5; nocturia = 2-3   DRE today reveals a 30 g benign feeling prostate  UA today entirely clear  Patient has not had routine PSA testing.  Last PSA noted in system 11/2016 = 2.0 PSA has been ordered by Dr. Janee Morn.  CT stone study 08/28/2022-- IMPRESSION: 1. No acute intra-abdominal or pelvic pathology. 2. Punctate nonobstructing right renal inferior pole calculus no hydronephrosis or obstructing calculus. 3. Sigmoid diverticulosis. No bowel obstruction. Normal appendix. 4.  Aortic Atherosclerosis (ICD10-I70.0).   Past Medical History:  Past Medical History:  Diagnosis Date   Arthritis    Phreesia 04/02/2020   Blockage of coronary artery of heart (HCC)    Chronic kidney disease    KIDNEY STONES   Dizziness    GERD (gastroesophageal  reflux disease)    Hyperlipidemia    Migraines    Vertigo     Past Surgical History:  Past Surgical History:  Procedure Laterality Date   EYE SURGERY     KNEE ARTHROSCOPY Left    LASIK Bilateral 2000   LEFT HEART CATH AND CORONARY ANGIOGRAPHY N/A 11/23/2019   Procedure: LEFT HEART CATH AND CORONARY ANGIOGRAPHY;  Surgeon: Lyn Records, MD;  Location: [facility];  Service: Cardiovascular;  Laterality: N/A;    Allergies:  Allergies  Allergen Reactions   Codeine     Anxious,intolerence,nervous,mainly cold medicine   Statins Other (See Comments)    Atorvastatin causes MYALGIAS    Family History:  Family History  Problem Relation Age of Onset   Healthy Mother    Macular degeneration Mother    Arthritis Mother    Heart disease Father    Stroke Father    Hyperlipidemia Father    COPD Sister    Hypertension Brother    Hyperlipidemia Brother    Hypertension Sister    Hyperlipidemia Brother    Parkinson's disease Paternal Grandfather    Stroke Paternal Grandfather    Heart failure Paternal Grandfather    Colon cancer Neg Hx    Esophageal cancer Neg Hx    Rectal cancer Neg Hx    Stomach cancer Neg Hx     Social History:  Social History   Tobacco Use   Smoking status: Former    Current packs/day: 0.00    Average packs/day: 2.0 packs/day for 30.0 years (60.0 ttl pk-yrs)    Types: Cigarettes  Start date: 01/29/1959    Quit date: 01/28/1989    Years since quitting: 33.6   Smokeless tobacco: Never  Vaping Use   Vaping status: Never Used  Substance Use Topics   Alcohol use: Yes    Alcohol/week: 2.0 standard drinks of alcohol    Types: 2 Cans of beer per week    Comment: weekends   Drug use: Not Currently    Types: Marijuana    Comment: Quit 4 years ago    Review of symptoms:  Constitutional:  Negative for unexplained weight loss, night sweats, fever, chills ENT:  Negative for nose bleeds, sinus pain, painful swallowing CV:  Negative for chest pain,  shortness of breath, exercise intolerance, palpitations, loss of consciousness Resp:  Negative for cough, wheezing, shortness of breath GI:  Negative for nausea, vomiting, diarrhea, bloody stools GU:  Positives noted in HPI; otherwise negative for gross hematuria, dysuria, urinary incontinence Neuro:  Negative for seizures, poor balance, limb weakness, slurred speech Psych:  Negative for lack of energy, depression, anxiety Endocrine:  Negative for polydipsia, polyuria, symptoms of hypoglycemia (dizziness, hunger, sweating) Hematologic:  Negative for anemia, purpura, petechia, prolonged or excessive bleeding, use of anticoagulants  Allergic:  Negative for difficulty breathing or choking as a result of exposure to anything; no shellfish allergy; no allergic response (rash/itch) to materials, foods  Physical exam: BP 110/73   Pulse 77   Ht 5\' 8"  (1.727 m)   Wt 228 lb (103.4 kg)   BMI 34.67 kg/m  GENERAL APPEARANCE:  Well appearing, well developed, well nourished, NAD  GU: Normal external genitalia DRE: Normal sphincter tone; prostate is approximately 30 g benign to palpation.  Results: UA entirely clear

## 2022-10-07 LAB — URINALYSIS, ROUTINE W REFLEX MICROSCOPIC
Bilirubin, UA: NEGATIVE
Glucose, UA: NEGATIVE
Ketones, UA: NEGATIVE
Leukocytes,UA: NEGATIVE
Nitrite, UA: NEGATIVE
Protein,UA: NEGATIVE
RBC, UA: NEGATIVE
Specific Gravity, UA: 1.025 (ref 1.005–1.030)
Urobilinogen, Ur: 0.2 mg/dL (ref 0.2–1.0)
pH, UA: 5.5 (ref 5.0–7.5)

## 2022-10-09 ENCOUNTER — Telehealth (HOSPITAL_BASED_OUTPATIENT_CLINIC_OR_DEPARTMENT_OTHER): Payer: Self-pay | Admitting: *Deleted

## 2022-10-09 DIAGNOSIS — K449 Diaphragmatic hernia without obstruction or gangrene: Secondary | ICD-10-CM | POA: Diagnosis not present

## 2022-10-09 DIAGNOSIS — K429 Umbilical hernia without obstruction or gangrene: Secondary | ICD-10-CM | POA: Diagnosis not present

## 2022-10-09 NOTE — Telephone Encounter (Signed)
   Pre-operative Risk Assessment    Patient Name: Martin Norris  DOB: 1950-06-22 MRN: [id number]      Request for Surgical Clearance    Procedure:   Hernia Surgery  Date of Surgery:  Clearance TBD                                 Surgeon:  Dr. Melody Haver Surgeon's Group or Practice Name:  [facility] Phone number:  [phone] Fax number:  [phone]   Type of Clearance Requested:   - Medical  - Pharmacy:  Hold Aspirin Not Indicated.   Type of Anesthesia:  General    Additional requests/questions:    Signed, Emmit Pomfret   10/09/2022, 10:33 AM

## 2022-10-09 NOTE — Telephone Encounter (Signed)
CORRECTION ON PH AND FAX#'S FOR REQUESTING OFFICE.   PH# [phone]  FAX# [phone]

## 2022-10-09 NOTE — Telephone Encounter (Signed)
S/w the pt about pre op appt and he tells me that he has been in the ED a couple of times in the last couple of weeks with high heart rates and fever and night sweats. Pt said he prefers to be seen I office. Pt said he will be going out of town soon, he asked if we could make appt when he is back home. Pt has been scheduled to see Robin Searing, NP 11/05/22 @ 1:55. Pt thanked me for the help toady.   I assured the pt that I will update all parties involved.

## 2022-10-09 NOTE — Telephone Encounter (Signed)
   Name: Martin Norris  DOB: 03/09/1950  MRN: [id number]  Primary Cardiologist: Donato Schultz, MD   Preoperative team, please contact this patient and set up a phone call appointment for further preoperative risk assessment. Please obtain consent and complete medication review. Thank you for your help.  I confirm that guidance regarding antiplatelet and oral anticoagulation therapy has been completed and, if necessary, noted below.  Patient's aspirin is not prescribed by cardiology.  Recommendations for holding aspirin will need to come from prescribing provider.   Ronney Asters, NP 10/09/2022, 11:02 AM [facility]

## 2022-11-04 DIAGNOSIS — M1711 Unilateral primary osteoarthritis, right knee: Secondary | ICD-10-CM | POA: Diagnosis not present

## 2022-11-04 DIAGNOSIS — M7121 Synovial cyst of popliteal space [Baker], right knee: Secondary | ICD-10-CM | POA: Diagnosis not present

## 2022-11-04 NOTE — Progress Notes (Addendum)
Cardiology Office Note    Patient Name: Martin Norris Date of Encounter: 11/04/2022  Primary Care Provider:  Garnette Gunner, MD Primary Cardiologist:  Donato Schultz, MD Primary Electrophysiologist: None   Past Medical History    Past Medical History:  Diagnosis Date   Arthritis    Phreesia 04/02/2020   Blockage of coronary artery of heart (HCC)    Chronic kidney disease    KIDNEY STONES   Dizziness    GERD (gastroesophageal reflux disease)    Hyperlipidemia    Migraines    Vertigo     History of Present Illness  Martin Norris is a 72 y.o. male with a PMH of coronary artery calcifications  coronary CTA completed showing flow limiting lesion in the LAD with LHC completed 11/2019 showing large second obtuse marginal that is functionally occluded receiving right to left collaterals.  He also demonstrated mid stenosis of up to 75% as well as 80 to 90% first diagonal , HLD (statin intolerance) who for preoperative clearance.  Martin Norris was seen initially by Dr. Anne Fu in 2021 and was last seen in 11/2021 for follow-up.  He was seen initially for complaint of chest pain coronary CTA revealing three-vessel disease with FFR completed showing flow-limiting lesion in LAD.  He underwent LHC for further evaluation that showed large second OM that was functionally occluded with right to left collaterals proximal and mid LAD disease of 60-75%'s that if medical management did not work and anginareturned he would consider CTO.  He was previously statin intolerant but is currently Crestor 10 mg and reports compliance with medications.  Patient was not having any anginal symptoms and LDL was 65 at goal.   During today's visit the patient reports that he has been doing well with no new cardiac complaints.  He does report a few episodes of tachycardia that resolved within 2 to 3 minutes and are not sustained.  His blood pressure today was elevated at 144/72.  He reports that his blood pressures at home  have been well-controlled.  He is scheduled to undergo an umbilical hernia repair and presents today for clearance.  During visit patient reported that he does activities such as chopping wood and moving wood piles with no angina but does report heavy shortness of breath with this activity.  He is compliant with his current medications and denies any adverse reactions.  Patient denies chest pain, palpitations, dyspnea, PND, orthopnea, nausea, vomiting, dizziness, syncope, edema, weight gain, or early satiety.  Review of Systems  Please see the history of present illness.    All other systems reviewed and are otherwise negative except as noted above.  Physical Exam    Wt Readings from Last 3 Encounters:  10/02/22 228 lb (103.4 kg)  09/19/22 228 lb (103.4 kg)  09/04/22 233 lb (105.7 kg)   RU:EAVWU were no vitals filed for this visit.,There is no height or weight on file to calculate BMI. GEN: Well nourished, well developed in no acute distress Neck: No JVD; No carotid bruits Pulmonary: Clear to auscultation without rales, wheezing or rhonchi  Cardiovascular: Normal rate. Regular rhythm. Normal S1. Normal S2.   Murmurs: There is no murmur.  ABDOMEN: Soft, non-tender, non-distended EXTREMITIES:  No edema; No deformity   EKG/LABS/ Recent Cardiac Studies   ECG personally reviewed by me today -sinus rhythm with rate of 71 bpm and no acute changes consistent with previous EKG.  Risk Assessment/Calculations:          Lab Results  Component Value Date   WBC 13.6 (H) 09/04/2022   HGB 14.6 09/04/2022   HCT 43.2 09/04/2022   MCV 89.1 09/04/2022   PLT 257 09/04/2022   Lab Results  Component Value Date   CREATININE 1.38 (H) 09/04/2022   BUN 20 09/04/2022   NA 131 (L) 09/04/2022   K 4.2 09/04/2022   CL 94 (L) 09/04/2022   CO2 24 09/04/2022   Lab Results  Component Value Date   CHOL 135 04/04/2021   HDL 41 04/04/2021   LDLCALC 65 04/04/2021   TRIG 175 (H) 04/04/2021   CHOLHDL 3.3  04/04/2021    Lab Results  Component Value Date   HGBA1C 5.4 04/04/2021   Assessment & Plan    1.  Preoperative clearance: -Patient's RCRI score is 6.6%  Myoview completed and patient is at acceptable risk to proceed with scheduled umbilical hernia repair.  -Patient may hold ASA 81 mg 7 days prior to procedure and should restart postprocedure when surgically safe.  2.  Coronary artery disease: -Patient underwent coronary CTA 10/2019 showing flow limiting lesion in the LAD with LHC completed 11/2019 with patent left main moderately severe proximal mid LAD occluded OM with left to right collaterals from PDA -Today reports shortness of breath with exertion but denies any stable angina -Continue Crestor 10 mg daily and ASA 81 mg -Patient will take nitroglycerin 0.4 mg as needed  3.  Hyperlipidemia: -Patient's last LDL cholesterol was 65 -Patient will update LFTs and lipids prior to stress test.  4.  Shortness of breath with exertion: -Patient reports shortness of breath that may be related to deconditioning however patient will complete Myoview for evaluation and to clear him for upcoming hernia surgery.      Disposition: Follow-up with Donato Schultz, MD or APP in 12 months Informed Consent   Shared Decision Making/Informed Consent The risks [chest pain, shortness of breath, cardiac arrhythmias, dizziness, blood pressure fluctuations, myocardial infarction, stroke/transient ischemic attack, nausea, vomiting, allergic reaction, radiation exposure, metallic taste sensation and life-threatening complications (estimated to be 1 in 10,000)], benefits (risk stratification, diagnosing coronary artery disease, treatment guidance) and alternatives of a nuclear stress test were discussed in detail with Martin Norris and he agrees to proceed.      Signed, Martin Norris, Leodis Rains, NP 11/04/2022, 7:32 AM [facility]

## 2022-11-05 ENCOUNTER — Encounter: Payer: Self-pay | Admitting: Nurse Practitioner

## 2022-11-05 ENCOUNTER — Ambulatory Visit: Payer: Medicare PPO | Attending: Nurse Practitioner | Admitting: Nurse Practitioner

## 2022-11-05 VITALS — BP 144/72 | HR 73 | Ht 67.0 in | Wt 226.0 lb

## 2022-11-05 DIAGNOSIS — E785 Hyperlipidemia, unspecified: Secondary | ICD-10-CM

## 2022-11-05 DIAGNOSIS — R0602 Shortness of breath: Secondary | ICD-10-CM | POA: Diagnosis not present

## 2022-11-05 DIAGNOSIS — Z01810 Encounter for preprocedural cardiovascular examination: Secondary | ICD-10-CM | POA: Diagnosis not present

## 2022-11-05 DIAGNOSIS — I2510 Atherosclerotic heart disease of native coronary artery without angina pectoris: Secondary | ICD-10-CM | POA: Diagnosis not present

## 2022-11-05 MED ORDER — NITROGLYCERIN 0.4 MG SL SUBL
0.4000 mg | SUBLINGUAL_TABLET | SUBLINGUAL | 3 refills | Status: DC | PRN
Start: 2022-11-05 — End: 2023-11-24

## 2022-11-05 NOTE — Patient Instructions (Addendum)
Medication Instructions:  START Nitroglycerin 0.4mg  Take 1 as needed for emergency chest pain. Take first dose for emergency chest pain; WAIT 5 minutes and if still having chest pain CALL 911 and then take 2nd dose. IF still having pain wait an additional 5 minutes before taking final dose. Do not take more than 3 doses in a day. *If you need a refill on your cardiac medications before your next appointment, please call your pharmacy*   Lab Work: COMPLETE LABS-LFTs & LIPIDS ON SAME DAY AS STRESS TEST  If you have labs (blood work) drawn today and your tests are completely normal, you will receive your results only by: MyChart Message (if you have MyChart) OR A paper copy in the mail If you have any lab test that is abnormal or we need to change your treatment, we will call you to review the results.   Testing/Procedures: Your physician has requested that you have an exercise stress myoview. For further information please visit [email or web address] Please follow instruction sheet, as given.   Follow-Up: At [facility], you and your health needs are our priority.  As part of our continuing mission to provide you with exceptional heart care, we have created designated Provider Care Teams.  These Care Teams include your primary Cardiologist (physician) and Advanced Practice Providers (APPs -  Physician Assistants and Nurse Practitioners) who all work together to provide you with the care you need, when you need it.  We recommend signing up for the patient portal called "MyChart".  Sign up information is provided on this After Visit Summary.  MyChart is used to connect with patients for Virtual Visits (Telemedicine).  Patients are able to view lab/test results, encounter notes, upcoming appointments, etc.  Non-urgent messages can be sent to your provider as well.   To learn more about what you can do with MyChart, go to [email or web address].    Your next appointment:   12  month(s)  Provider:   Donato Schultz, MD     Other Instructions

## 2022-11-18 ENCOUNTER — Ambulatory Visit (INDEPENDENT_AMBULATORY_CARE_PROVIDER_SITE_OTHER): Payer: Medicare PPO

## 2022-11-18 DIAGNOSIS — Z0000 Encounter for general adult medical examination without abnormal findings: Secondary | ICD-10-CM

## 2022-11-18 NOTE — Patient Instructions (Signed)
Martin Norris , Thank you for taking time to come for your Medicare Wellness Visit. I appreciate your ongoing commitment to your health goals. Please review the following plan we discussed and let me know if I can assist you in the future.   Referrals/Orders/Follow-Ups/Clinician Recommendations: none  This is a list of the screening recommended for you and due dates:  Health Maintenance  Topic Date Due   COVID-19 Vaccine (5 - 2023-24 season) 09/29/2022   Zoster (Shingles) Vaccine (1 of 2) 02/18/2023*   Flu Shot  04/28/2023*   Pneumonia Vaccine (1 of 1 - PCV) 11/18/2023*   Medicare Annual Wellness Visit  11/18/2023   DTaP/Tdap/Td vaccine (2 - Td or Tdap) 05/23/2027   Colon Cancer Screening  06/03/2030   Hepatitis C Screening  Completed   HPV Vaccine  Aged Out   Screening for Lung Cancer  Discontinued  *Topic was postponed. The date shown is not the original due date.    Advanced directives: (ACP Link)Information on Advanced Care Planning can be found at [facility] [place] Advance Health Care Directives Advance Health Care Directives ([email or web address]   Next Medicare Annual Wellness Visit scheduled for next year: Yes  insert Preventive Care attachment Insert FALL PREVENTION attachment if needed

## 2022-11-18 NOTE — Progress Notes (Signed)
Subjective:   Martin Norris is a 72 y.o. male who presents for Medicare Annual/Subsequent preventive examination.  Visit Complete: Virtual I connected with  Martin Norris on 11/18/22 by a audio enabled telemedicine application and verified that I am speaking with the correct person using two identifiers.  Patient Location: Home  Provider Location: Office/Clinic  I discussed the limitations of evaluation and management by telemedicine. The patient expressed understanding and agreed to proceed.  Vital Signs: Because this visit was a virtual/telehealth visit, some criteria may be missing or patient reported. Any vitals not documented were not able to be obtained and vitals that have been documented are patient reported.    Cardiac Risk Factors include: advanced age (>72men, >58 women);dyslipidemia;male gender     Objective:    Today's Vitals   11/18/22 1111  PainSc: 5    There is no height or weight on file to calculate BMI.     11/18/2022   11:19 AM 09/04/2022   10:36 AM 08/28/2022    8:27 PM 11/23/2019    8:39 AM 05/22/2017   11:03 AM  Advanced Directives  Does Patient Have a Medical Advance Directive? No No No No No  Would patient like information on creating a medical advance directive?   No - Patient declined No - Patient declined No - Patient declined    Current Medications (verified) Outpatient Encounter Medications as of 11/18/2022  Medication Sig   alfuzosin (UROXATRAL) 10 MG 24 hr tablet Take 1 tablet (10 mg total) by mouth daily with breakfast.   aspirin EC 81 MG tablet Take 1 tablet (81 mg total) by mouth daily. Swallow whole. (Patient taking differently: Take 81 mg by mouth every evening. Swallow whole.)   Ibuprofen (ADVIL PO) Take 200 mg by mouth 3 (three) times daily as needed (pain). Duo   nitroGLYCERIN (NITROSTAT) 0.4 MG SL tablet Place 1 tablet (0.4 mg total) under the tongue every 5 (five) minutes as needed.   rosuvastatin (CRESTOR) 10 MG tablet Take 1  tablet (10 mg total) by mouth daily.   No facility-administered encounter medications on file as of 11/18/2022.    Allergies (verified) Codeine and Statins   History: Past Medical History:  Diagnosis Date   Arthritis    Phreesia 04/02/2020   Blockage of coronary artery of heart (HCC)    Chronic kidney disease    KIDNEY STONES   Dizziness    GERD (gastroesophageal reflux disease)    Hyperlipidemia    Migraines    Vertigo    Past Surgical History:  Procedure Laterality Date   EYE SURGERY     KNEE ARTHROSCOPY Left    LASIK Bilateral 2000   LEFT HEART CATH AND CORONARY ANGIOGRAPHY N/A 11/23/2019   Procedure: LEFT HEART CATH AND CORONARY ANGIOGRAPHY;  Surgeon: Lyn Records, MD;  Location: [facility];  Service: Cardiovascular;  Laterality: N/A;   Family History  Problem Relation Age of Onset   Healthy Mother    Macular degeneration Mother    Arthritis Mother    Heart disease Father    Stroke Father    Hyperlipidemia Father    COPD Sister    Hypertension Brother    Hyperlipidemia Brother    Hypertension Sister    Hyperlipidemia Brother    Parkinson's disease Paternal Grandfather    Stroke Paternal Grandfather    Heart failure Paternal Grandfather    Colon cancer Neg Hx    Esophageal cancer Neg Hx    Rectal  cancer Neg Hx    Stomach cancer Neg Hx    Social History   Socioeconomic History   Marital status: Married    Spouse name: [name]   Number of children: 2   Years of education: Not on file   Highest education level: GED or equivalent  Occupational History    Comment: retired Materials engineer maintenance  Tobacco Use   Smoking status: Former    Current packs/day: 0.00    Average packs/day: 2.0 packs/day for 30.0 years (60.0 ttl pk-yrs)    Types: Cigarettes    Start date: 01/29/1959    Quit date: 01/28/1989    Years since quitting: 33.8   Smokeless tobacco: Never  Vaping Use   Vaping status: Never Used  Substance and Sexual Activity   Alcohol use: Yes     Alcohol/week: 2.0 standard drinks of alcohol    Types: 2 Cans of beer per week    Comment: weekends   Drug use: Not Currently    Types: Marijuana    Comment: Quit 4 years ago   Sexual activity: Not Currently    Birth control/protection: None  Other Topics Concern   Not on file  Social History Narrative   Lives with wife   Caffeine- coffee 1 c, Coke 1 daily, tea 2-3 daily   Social Determinants of Health   Financial Resource Strain: Low Risk  (11/18/2022)   Overall Financial Resource Strain (CARDIA)    Difficulty of Paying Living Expenses: Not hard at all  Food Insecurity: No Food Insecurity (11/18/2022)   Hunger Vital Sign    Worried About Running Out of Food in the Last Year: Never true    Ran Out of Food in the Last Year: Never true  Transportation Needs: No Transportation Needs (11/18/2022)   PRAPARE - Administrator, Civil Service (Medical): No    Lack of Transportation (Non-Medical): No  Physical Activity: Sufficiently Active (11/18/2022)   Exercise Vital Sign    Days of Exercise per Week: 7 days    Minutes of Exercise per Session: 60 min  Recent Concern: Physical Activity - Insufficiently Active (09/17/2022)   Exercise Vital Sign    Days of Exercise per Week: 2 days    Minutes of Exercise per Session: 30 min  Stress: No Stress Concern Present (11/18/2022)   [employer] of Occupational Health - Occupational Stress Questionnaire    Feeling of Stress : Not at all  Social Connections: Moderately Integrated (11/18/2022)   Social Connection and Isolation Panel [NHANES]    Frequency of Communication with Friends and Family: More than three times a week    Frequency of Social Gatherings with Friends and Family: More than three times a week    Attends Religious Services: 1 to 4 times per year    Active Member of [employer] or Organizations: No    Attends Engineer, structural: Never    Marital Status: Married    Tobacco Counseling Counseling given: Not  Answered   Clinical Intake:  Pre-visit preparation completed: Yes  Pain : 0-10 Pain Score: 5  Pain Type: Chronic pain Pain Location: Knee Pain Orientation: Right Pain Descriptors / Indicators: Aching Pain Onset: More than a month ago Pain Frequency: Constant     Nutritional Risks: None Diabetes: No  How often do you need to have someone help you when you read instructions, pamphlets, or other written materials from your doctor or pharmacy?: 1 - Never  Interpreter Needed?: No  Information entered by ::  NAllen LPN   Activities of Daily Living    11/18/2022   11:13 AM  In your present state of health, do you have any difficulty performing the following activities:  Hearing? 1  Comment trouble understanding  Vision? 0  Difficulty concentrating or making decisions? 0  Walking or climbing stairs? 0  Dressing or bathing? 0  Doing errands, shopping? 0  Preparing Food and eating ? N  Using the Toilet? N  In the past six months, have you accidently leaked urine? N  Do you have problems with loss of bowel control? N  Managing your Medications? N  Managing your Finances? N  Housekeeping or managing your Housekeeping? N    Patient Care Team: Garnette Gunner, MD as PCP - General (Family Medicine) Jake Bathe, MD as PCP - Cardiology (Cardiology)  Indicate any recent Medical Services you may have received from other than [facility] providers in the past year (date may be approximate).     Assessment:   This is a routine wellness examination for Martin Norris.  Hearing/Vision screen Hearing Screening - Comments:: Has trouble understanding Vision Screening - Comments:: Regular eye exams, [facility]   Goals Addressed             This Visit's Progress    Patient Stated       11/18/2022, continue losing weight       Depression Screen    11/18/2022   11:21 AM 09/04/2022    9:16 AM 04/11/2021    8:45 AM 10/06/2020   10:23 AM 04/05/2020    9:23 AM 12/07/2019   10:34 AM  09/08/2019    9:56 AM  PHQ 2/9 Scores  PHQ - 2 Score 0 0 0 0 1 2 2   PHQ- 9 Score 0 9  2 3 4 5     Fall Risk    11/18/2022   11:20 AM 09/04/2022    9:16 AM 04/11/2021    8:44 AM 10/06/2020   10:23 AM 04/05/2020    9:23 AM  Fall Risk   Falls in the past year? 0 0 0 0 0  Number falls in past yr: 0 0 0 0   Injury with Fall? 0 0 0 0   Risk for fall due to : Medication side effect  No Fall Risks No Fall Risks   Follow up Falls prevention discussed;Falls evaluation completed  Falls evaluation completed Falls evaluation completed Falls evaluation completed    MEDICARE RISK AT HOME: Medicare Risk at Home Any stairs in or around the home?: Yes If so, are there any without handrails?: No Home free of loose throw rugs in walkways, pet beds, electrical cords, etc?: Yes Adequate lighting in your home to reduce risk of falls?: Yes Life alert?: No Use of a cane, walker or w/c?: No Grab bars in the bathroom?: No Shower chair or bench in shower?: No Elevated toilet seat or a handicapped toilet?: No  TIMED UP AND GO:  Was the test performed?  No    Cognitive Function:        11/18/2022   11:22 AM 04/11/2021    8:45 AM 04/05/2020    9:24 AM  6CIT Screen  What Year? 0 points 0 points 0 points  What month? 0 points 0 points 0 points  What time? 0 points 0 points 0 points  Count back from 20 0 points 0 points 0 points  Months in reverse 0 points 0 points 0 points  Repeat phrase 0 points  0 points 0 points  Total Score 0 points 0 points 0 points    Immunizations Immunization History  Administered Date(s) Administered   Fluad Quad(high Dose 65+) 10/06/2020   Influenza, High Dose Seasonal PF 12/07/2019   Influenza-Unspecified 11/20/2017   PFIZER(Purple Top)SARS-COV-2 Vaccination 02/18/2019, 03/11/2019, 10/29/2019   Pfizer Covid-19 Vaccine Bivalent Booster 23yrs & up 12/14/2020   Tdap 05/22/2017    TDAP status: Up to date  Flu Vaccine status: Due, Education has been provided regarding  the importance of this vaccine. Advised may receive this vaccine at local pharmacy or Health Dept. Aware to provide a copy of the vaccination record if obtained from local pharmacy or Health Dept. Verbalized acceptance and understanding.  Pneumococcal vaccine status: Declined,  Education has been provided regarding the importance of this vaccine but patient still declined. Advised may receive this vaccine at local pharmacy or Health Dept. Aware to provide a copy of the vaccination record if obtained from local pharmacy or Health Dept. Verbalized acceptance and understanding.   Covid-19 vaccine status: Information provided on how to obtain vaccines.   Qualifies for Shingles Vaccine? Yes   Zostavax completed No   Shingrix Completed?: No.    Education has been provided regarding the importance of this vaccine. Patient has been advised to call insurance company to determine out of pocket expense if they have not yet received this vaccine. Advised may also receive vaccine at local pharmacy or Health Dept. Verbalized acceptance and understanding.  Screening Tests Health Maintenance  Topic Date Due   COVID-19 Vaccine (5 - 2023-24 season) 09/29/2022   Zoster Vaccines- Shingrix (1 of 2) 02/18/2023 (Originally 01/12/2003)   INFLUENZA VACCINE  04/28/2023 (Originally 08/29/2022)   Pneumonia Vaccine 99+ Years old (1 of 1 - PCV) 11/18/2023 (Originally 01/11/2018)   Medicare Annual Wellness (AWV)  11/18/2023   DTaP/Tdap/Td (2 - Td or Tdap) 05/23/2027   Colonoscopy  06/03/2030   Hepatitis C Screening  Completed   HPV VACCINES  Aged Out   Lung Cancer Screening  Discontinued    Health Maintenance  Health Maintenance Due  Topic Date Due   COVID-19 Vaccine (5 - 2023-24 season) 09/29/2022    Colorectal cancer screening: Type of screening: Colonoscopy. Completed 06/02/2020. Repeat every 10 years  Lung Cancer Screening: (Low Dose CT Chest recommended if Age 80-80 years, 20 pack-year currently smoking OR have  quit w/in 15years.) does not qualify.   Lung Cancer Screening Referral: no  Additional Screening:  Hepatitis C Screening: does qualify; Completed 10/23/2017  Vision Screening: Recommended annual ophthalmology exams for early detection of glaucoma and other disorders of the eye. Is the patient up to date with their annual eye exam?  Yes  Who is the provider or what is the name of the office in which the patient attends annual eye exams? [facility] If pt is not established with a provider, would they like to be referred to a provider to establish care? No .   Dental Screening: Recommended annual dental exams for proper oral hygiene  Diabetic Foot Exam: n/a  Community Resource Referral / Chronic Care Management: CRR required this visit?  No   CCM required this visit?  No     Plan:     I have personally reviewed and noted the following in the patient's chart:   Medical and social history Use of alcohol, tobacco or illicit drugs  Current medications and supplements including opioid prescriptions. Patient is not currently taking opioid prescriptions. Functional ability and status Nutritional status  Physical activity Advanced directives List of other physicians Hospitalizations, surgeries, and ER visits in previous 12 months Vitals Screenings to include cognitive, depression, and falls Referrals and appointments  In addition, I have reviewed and discussed with patient certain preventive protocols, quality metrics, and best practice recommendations. A written personalized care plan for preventive services as well as general preventive health recommendations were provided to patient.     Barb Merino, LPN   29/56/2130   After Visit Summary: (MyChart) Due to this being a telephonic visit, the after visit summary with patients personalized plan was offered to patient via MyChart   Nurse Notes: none

## 2022-11-26 ENCOUNTER — Encounter (HOSPITAL_COMMUNITY): Payer: Self-pay

## 2022-11-28 ENCOUNTER — Ambulatory Visit: Payer: Medicare PPO

## 2022-11-28 ENCOUNTER — Ambulatory Visit: Payer: Medicare PPO | Attending: Cardiology

## 2022-11-28 DIAGNOSIS — E785 Hyperlipidemia, unspecified: Secondary | ICD-10-CM

## 2022-11-28 DIAGNOSIS — I2510 Atherosclerotic heart disease of native coronary artery without angina pectoris: Secondary | ICD-10-CM

## 2022-11-28 DIAGNOSIS — Z01810 Encounter for preprocedural cardiovascular examination: Secondary | ICD-10-CM | POA: Diagnosis not present

## 2022-11-28 LAB — MYOCARDIAL PERFUSION IMAGING
Angina Index: 0
Duke Treadmill Score: 4
Estimated workload: 6.2
Exercise duration (min): 4 min
Exercise duration (sec): 22 s
LV dias vol: 127 mL (ref 62–150)
LV sys vol: 70 mL
MPHR: 151 {beats}/min
Nuc Stress EF: 45 %
Peak HR: 144 {beats}/min
Percent HR: 95 %
Rest HR: 75 {beats}/min
Rest Nuclear Isotope Dose: 9.3 mCi
SDS: 2
SRS: 0
SSS: 2
ST Depression (mm): 0 mm
Stress Nuclear Isotope Dose: 27.6 mCi
TID: 1.16

## 2022-11-28 MED ORDER — TECHNETIUM TC 99M TETROFOSMIN IV KIT
9.3000 | PACK | Freq: Once | INTRAVENOUS | Status: AC | PRN
  Administered 2022-11-28: 9.3 via INTRAVENOUS

## 2022-11-28 MED ORDER — TECHNETIUM TC 99M TETROFOSMIN IV KIT
27.6000 | PACK | Freq: Once | INTRAVENOUS | Status: AC | PRN
  Administered 2022-11-28: 27.6 via INTRAVENOUS

## 2022-11-29 LAB — LIPID PANEL
Chol/HDL Ratio: 3.1 ratio (ref 0.0–5.0)
Cholesterol, Total: 110 mg/dL (ref 100–199)
HDL: 35 mg/dL — ABNORMAL LOW (ref >39)
LDL Chol Calc (NIH): 52 mg/dL (ref 0–99)
Triglycerides: 132 mg/dL (ref 0–149)
VLDL Cholesterol Cal: 23 mg/dL (ref 5–40)

## 2022-11-29 LAB — HEPATIC FUNCTION PANEL
ALT: 22 [IU]/L (ref 0–44)
AST: 27 [IU]/L (ref 0–40)
Albumin: 4 g/dL (ref 3.9–4.9)
Alkaline Phosphatase: 61 [IU]/L (ref 44–121)
Bilirubin Total: 0.6 mg/dL (ref 0.0–1.2)
Bilirubin, Direct: 0.19 mg/dL (ref 0.00–0.40)
Total Protein: 5.9 g/dL — ABNORMAL LOW (ref 6.0–8.5)

## 2022-12-05 DIAGNOSIS — M1711 Unilateral primary osteoarthritis, right knee: Secondary | ICD-10-CM | POA: Diagnosis not present

## 2022-12-19 DIAGNOSIS — H2513 Age-related nuclear cataract, bilateral: Secondary | ICD-10-CM | POA: Diagnosis not present

## 2022-12-19 DIAGNOSIS — H43811 Vitreous degeneration, right eye: Secondary | ICD-10-CM | POA: Diagnosis not present

## 2023-01-16 ENCOUNTER — Other Ambulatory Visit (HOSPITAL_BASED_OUTPATIENT_CLINIC_OR_DEPARTMENT_OTHER): Payer: Self-pay

## 2023-01-16 MED ORDER — COMIRNATY 30 MCG/0.3ML IM SUSY
0.3000 mL | PREFILLED_SYRINGE | Freq: Once | INTRAMUSCULAR | 0 refills | Status: AC
  Filled 2023-01-16: qty 0.3, 1d supply, fill #0

## 2023-01-16 MED ORDER — FLUAD 0.5 ML IM SUSY
0.5000 mL | PREFILLED_SYRINGE | Freq: Once | INTRAMUSCULAR | 0 refills | Status: AC
  Filled 2023-01-16: qty 0.5, 1d supply, fill #0

## 2023-01-24 ENCOUNTER — Telehealth: Payer: Self-pay | Admitting: Nurse Practitioner

## 2023-01-24 MED ORDER — ROSUVASTATIN CALCIUM 10 MG PO TABS: 10.0000 mg | ORAL_TABLET | Freq: Every day | ORAL | 2 refills | Status: DC

## 2023-01-24 NOTE — Telephone Encounter (Signed)
Pt's medication was sent to pt's pharmacy as requested. Confirmation received.  °

## 2023-01-24 NOTE — Telephone Encounter (Signed)
*  STAT* If patient is at the pharmacy, call can be transferred to refill team.   1. Which medications need to be refilled? (please list name of each medication and dose if known)   rosuvastatin (CRESTOR) 10 MG tablet    4. Which pharmacy/location (including street and city if local pharmacy) is medication to be sent to? [facility] [address] [place]), [place] - [address] Phone: [phone]  Fax: [phone]       5. Do they need a 30 day or 90 day supply? 90

## 2023-02-03 ENCOUNTER — Encounter (HOSPITAL_BASED_OUTPATIENT_CLINIC_OR_DEPARTMENT_OTHER): Payer: Self-pay | Admitting: General Surgery

## 2023-02-03 ENCOUNTER — Other Ambulatory Visit: Payer: Self-pay

## 2023-02-07 ENCOUNTER — Ambulatory Visit: Payer: Self-pay | Admitting: General Surgery

## 2023-02-10 ENCOUNTER — Encounter (HOSPITAL_BASED_OUTPATIENT_CLINIC_OR_DEPARTMENT_OTHER)
Admission: RE | Disposition: A | Discharge status: Home / Self Care | Payer: Self-pay | Source: Home / Self Care | Attending: General Surgery

## 2023-02-10 ENCOUNTER — Encounter (HOSPITAL_BASED_OUTPATIENT_CLINIC_OR_DEPARTMENT_OTHER): Payer: Self-pay | Admitting: General Surgery

## 2023-02-10 ENCOUNTER — Other Ambulatory Visit: Payer: Self-pay

## 2023-02-10 ENCOUNTER — Ambulatory Visit (HOSPITAL_BASED_OUTPATIENT_CLINIC_OR_DEPARTMENT_OTHER)
Admission: RE | Admit: 2023-02-10 | Discharge: 2023-02-10 | Disposition: A | Discharge status: Home / Self Care | Payer: Medicare PPO | Attending: General Surgery | Admitting: General Surgery

## 2023-02-10 ENCOUNTER — Ambulatory Visit (HOSPITAL_BASED_OUTPATIENT_CLINIC_OR_DEPARTMENT_OTHER): Payer: Self-pay | Admitting: Anesthesiology

## 2023-02-10 DIAGNOSIS — K429 Umbilical hernia without obstruction or gangrene: Secondary | ICD-10-CM

## 2023-02-10 DIAGNOSIS — Z87891 Personal history of nicotine dependence: Secondary | ICD-10-CM | POA: Diagnosis not present

## 2023-02-10 DIAGNOSIS — Z01818 Encounter for other preprocedural examination: Secondary | ICD-10-CM

## 2023-02-10 DIAGNOSIS — I2510 Atherosclerotic heart disease of native coronary artery without angina pectoris: Secondary | ICD-10-CM | POA: Insufficient documentation

## 2023-02-10 HISTORY — PX: UMBILICAL HERNIA REPAIR: SHX196

## 2023-02-10 SURGERY — REPAIR, HERNIA, UMBILICAL, ADULT
Anesthesia: General | Site: Abdomen

## 2023-02-10 MED ORDER — FENTANYL CITRATE (PF) 100 MCG/2ML IJ SOLN
INTRAMUSCULAR | Status: AC
  Filled 2023-02-10: qty 2

## 2023-02-10 MED ORDER — FENTANYL CITRATE (PF) 100 MCG/2ML IJ SOLN
25.0000 ug | INTRAMUSCULAR | Status: DC | PRN
  Administered 2023-02-10 (×2): 50 ug via INTRAVENOUS

## 2023-02-10 MED ORDER — ACETAMINOPHEN 160 MG/5ML PO SOLN: 325.0000 mg | ORAL | Status: DC | PRN

## 2023-02-10 MED ORDER — PROPOFOL 10 MG/ML IV BOLUS
INTRAVENOUS | Status: DC | PRN
  Administered 2023-02-10: 150 mg via INTRAVENOUS

## 2023-02-10 MED ORDER — BUPIVACAINE-EPINEPHRINE 0.25% -1:200000 IJ SOLN
INTRAMUSCULAR | Status: DC | PRN
  Administered 2023-02-10: 20 mL

## 2023-02-10 MED ORDER — KETOROLAC TROMETHAMINE 30 MG/ML IJ SOLN
INTRAMUSCULAR | Status: DC | PRN
  Administered 2023-02-10: 30 mg via INTRAVENOUS

## 2023-02-10 MED ORDER — IBUPROFEN 200 MG PO TABS: 600.0000 mg | ORAL_TABLET | Freq: Four times a day (QID) | ORAL | 0 refills | Status: AC

## 2023-02-10 MED ORDER — PROPOFOL 10 MG/ML IV BOLUS
INTRAVENOUS | Status: AC
  Filled 2023-02-10: qty 20

## 2023-02-10 MED ORDER — DROPERIDOL 2.5 MG/ML IJ SOLN
0.6250 mg | Freq: Once | INTRAMUSCULAR | Status: AC | PRN
Start: 2023-02-10 — End: 2023-02-10
  Administered 2023-02-10: 0.625 mg via INTRAVENOUS

## 2023-02-10 MED ORDER — OXYCODONE HCL 5 MG PO TABS
5.0000 mg | ORAL_TABLET | Freq: Once | ORAL | Status: AC | PRN
  Administered 2023-02-10: 5 mg via ORAL

## 2023-02-10 MED ORDER — CEFAZOLIN SODIUM-DEXTROSE 2-4 GM/100ML-% IV SOLN
INTRAVENOUS | Status: AC
  Filled 2023-02-10: qty 100

## 2023-02-10 MED ORDER — OXYCODONE HCL 5 MG PO TABS: 5.0000 mg | ORAL_TABLET | Freq: Three times a day (TID) | ORAL | 0 refills | Status: AC | PRN

## 2023-02-10 MED ORDER — OXYCODONE HCL 5 MG/5ML PO SOLN: 5.0000 mg | Freq: Once | ORAL | Status: AC | PRN

## 2023-02-10 MED ORDER — CHLORHEXIDINE GLUCONATE CLOTH 2 % EX PADS: 6.0000 | MEDICATED_PAD | Freq: Once | CUTANEOUS | Status: DC

## 2023-02-10 MED ORDER — ACETAMINOPHEN 500 MG PO TABS
1000.0000 mg | ORAL_TABLET | ORAL | Status: AC
  Administered 2023-02-10: 1000 mg via ORAL

## 2023-02-10 MED ORDER — DEXAMETHASONE SODIUM PHOSPHATE 10 MG/ML IJ SOLN
INTRAMUSCULAR | Status: AC
  Filled 2023-02-10: qty 1

## 2023-02-10 MED ORDER — ROCURONIUM BROMIDE 10 MG/ML (PF) SYRINGE
PREFILLED_SYRINGE | INTRAVENOUS | Status: AC
  Filled 2023-02-10: qty 10

## 2023-02-10 MED ORDER — DROPERIDOL 2.5 MG/ML IJ SOLN
INTRAMUSCULAR | Status: AC
  Filled 2023-02-10: qty 2

## 2023-02-10 MED ORDER — CEFAZOLIN SODIUM-DEXTROSE 2-4 GM/100ML-% IV SOLN
2.0000 g | INTRAVENOUS | Status: AC
  Administered 2023-02-10: 2 g via INTRAVENOUS

## 2023-02-10 MED ORDER — ACETAMINOPHEN 325 MG PO TABS: 650.0000 mg | ORAL_TABLET | Freq: Four times a day (QID) | ORAL | 0 refills | Status: AC

## 2023-02-10 MED ORDER — OXYCODONE HCL 5 MG PO TABS
ORAL_TABLET | ORAL | Status: AC
  Filled 2023-02-10: qty 1

## 2023-02-10 MED ORDER — DEXAMETHASONE SODIUM PHOSPHATE 10 MG/ML IJ SOLN
INTRAMUSCULAR | Status: DC | PRN
  Administered 2023-02-10: 8 mg via INTRAVENOUS

## 2023-02-10 MED ORDER — ACETAMINOPHEN 325 MG PO TABS: 325.0000 mg | ORAL_TABLET | ORAL | Status: DC | PRN

## 2023-02-10 MED ORDER — SODIUM CHLORIDE 0.9 % IV SOLN: INTRAVENOUS | Status: DC | PRN

## 2023-02-10 MED ORDER — ROCURONIUM BROMIDE 10 MG/ML (PF) SYRINGE
PREFILLED_SYRINGE | INTRAVENOUS | Status: DC | PRN
  Administered 2023-02-10: 50 mg via INTRAVENOUS

## 2023-02-10 MED ORDER — 0.9 % SODIUM CHLORIDE (POUR BTL) OPTIME
TOPICAL | Status: DC | PRN
  Administered 2023-02-10: 1000 mL

## 2023-02-10 MED ORDER — ONDANSETRON HCL 4 MG/2ML IJ SOLN
INTRAMUSCULAR | Status: DC | PRN
  Administered 2023-02-10: 4 mg via INTRAVENOUS

## 2023-02-10 MED ORDER — FENTANYL CITRATE (PF) 100 MCG/2ML IJ SOLN
INTRAMUSCULAR | Status: DC | PRN
  Administered 2023-02-10: 100 ug via INTRAVENOUS

## 2023-02-10 MED ORDER — LACTATED RINGERS IV SOLN: INTRAVENOUS | Status: DC

## 2023-02-10 MED ORDER — ACETAMINOPHEN 500 MG PO TABS
ORAL_TABLET | ORAL | Status: AC
  Filled 2023-02-10: qty 2

## 2023-02-10 MED ORDER — LIDOCAINE 2% (20 MG/ML) 5 ML SYRINGE
INTRAMUSCULAR | Status: DC | PRN
  Administered 2023-02-10: 40 mg via INTRAVENOUS

## 2023-02-10 MED ORDER — SUGAMMADEX SODIUM 200 MG/2ML IV SOLN
INTRAVENOUS | Status: DC | PRN
  Administered 2023-02-10: 200 mg via INTRAVENOUS

## 2023-02-10 MED ORDER — LIDOCAINE 2% (20 MG/ML) 5 ML SYRINGE
INTRAMUSCULAR | Status: AC
  Filled 2023-02-10: qty 5

## 2023-02-10 MED ORDER — ONDANSETRON HCL 4 MG/2ML IJ SOLN
INTRAMUSCULAR | Status: AC
  Filled 2023-02-10: qty 2

## 2023-02-10 MED ORDER — ACETAMINOPHEN 10 MG/ML IV SOLN: 1000.0000 mg | Freq: Once | INTRAVENOUS | Status: DC | PRN

## 2023-02-10 MED ORDER — BUPIVACAINE LIPOSOME 1.3 % IJ SUSP: 20.0000 mL | Freq: Once | INTRAMUSCULAR | Status: DC

## 2023-02-10 SURGICAL SUPPLY — 34 items
BLADE CLIPPER SURG (BLADE) IMPLANT
BLADE SURG 15 STRL LF DISP TIS (BLADE) ×1 IMPLANT
CANISTER SUCT 1200ML W/VALVE (MISCELLANEOUS) IMPLANT
CHLORAPREP W/TINT 26 (MISCELLANEOUS) ×1 IMPLANT
COVER BACK TABLE 60X90IN (DRAPES) ×1 IMPLANT
COVER MAYO STAND STRL (DRAPES) ×1 IMPLANT
DERMABOND ADVANCED .7 DNX12 (GAUZE/BANDAGES/DRESSINGS) ×1 IMPLANT
DRAPE LAPAROTOMY 100X72 PEDS (DRAPES) ×1 IMPLANT
DRAPE UTILITY XL STRL (DRAPES) ×1 IMPLANT
ELECT COATED BLADE 2.86 ST (ELECTRODE) ×1 IMPLANT
ELECT REM PT RETURN 9FT ADLT (ELECTROSURGICAL) ×1 IMPLANT
ELECTRODE REM PT RTRN 9FT ADLT (ELECTROSURGICAL) ×1 IMPLANT
GLOVE BIO SURGEON STRL SZ7 (GLOVE) ×1 IMPLANT
GLOVE BIOGEL PI IND STRL 7.5 (GLOVE) ×1 IMPLANT
GOWN STRL REUS W/ TWL LRG LVL3 (GOWN DISPOSABLE) ×1 IMPLANT
GOWN STRL REUS W/ TWL XL LVL3 (GOWN DISPOSABLE) ×1 IMPLANT
MESH VENTRALEX ST 1-7/10 CRC S (Mesh General) IMPLANT
NDL HYPO 25X1 1.5 SAFETY (NEEDLE) ×1 IMPLANT
NEEDLE HYPO 25X1 1.5 SAFETY (NEEDLE) ×1 IMPLANT
NS IRRIG 1000ML POUR BTL (IV SOLUTION) IMPLANT
PACK BASIN DAY SURGERY FS (CUSTOM PROCEDURE TRAY) ×1 IMPLANT
PENCIL SMOKE EVACUATOR (MISCELLANEOUS) ×1 IMPLANT
SLEEVE SCD COMPRESS KNEE MED (STOCKING) ×1 IMPLANT
SUT MNCRL AB 4-0 PS2 18 (SUTURE) ×1 IMPLANT
SUT NOVA 0 T19/GS 22DT (SUTURE) IMPLANT
SUT PDS AB 0 CT1 27 (SUTURE) ×1 IMPLANT
SUT SILK 3 0 SH 30 (SUTURE) IMPLANT
SUT VIC AB 2-0 SH 27XBRD (SUTURE) IMPLANT
SUT VIC AB 3-0 SH 27X BRD (SUTURE) ×1 IMPLANT
SUT VICRYL 3-0 CR8 SH (SUTURE) IMPLANT
SYR CONTROL 10ML LL (SYRINGE) ×1 IMPLANT
TOWEL GREEN STERILE FF (TOWEL DISPOSABLE) ×1 IMPLANT
TUBE CONNECTING 20X1/4 (TUBING) IMPLANT
YANKAUER SUCT BULB TIP NO VENT (SUCTIONS) IMPLANT

## 2023-02-10 NOTE — Anesthesia Preprocedure Evaluation (Signed)
 Anesthesia Evaluation  Patient identified by MRN, date of birth, ID band Patient awake    Reviewed: Allergy & Precautions, NPO status , Patient's Chart, lab work & pertinent test results  Airway Mallampati: II  TM Distance: >3 FB Neck ROM: Full    Dental  (+) Teeth Intact, Dental Advisory Given   Pulmonary former smoker   breath sounds clear to auscultation       Cardiovascular + CAD   Rhythm:Regular Rate:Normal     Neuro/Psych  Headaches  negative psych ROS   GI/Hepatic Neg liver ROS, hiatal hernia,GERD  ,,  Endo/Other  negative endocrine ROS    Renal/GU Renal disease     Musculoskeletal  (+) Arthritis ,    Abdominal   Peds  Hematology negative hematology ROS (+)   Anesthesia Other Findings - HLD  Reproductive/Obstetrics                             Anesthesia Physical Anesthesia Plan  ASA: 2  Anesthesia Plan: General   Post-op Pain Management: Tylenol  PO (pre-op)* and Toradol  IV (intra-op)*   Induction: Intravenous  PONV Risk Score and Plan: 3 and Dexamethasone , Midazolam  and Ondansetron   Airway Management Planned: Oral ETT  Additional Equipment: None  Intra-op Plan:   Post-operative Plan: Extubation in OR  Informed Consent: I have reviewed the patients History and Physical, chart, labs and discussed the procedure including the risks, benefits and alternatives for the proposed anesthesia with the patient or authorized representative who has indicated his/her understanding and acceptance.     Dental advisory given  Plan Discussed with: CRNA  Anesthesia Plan Comments:        Anesthesia Quick Evaluation

## 2023-02-10 NOTE — Anesthesia Procedure Notes (Signed)
 Procedure Name: Intubation Date/Time: 02/10/2023 2:33 PM  Performed by: Claudene Delon SQUIBB, CRNAPre-anesthesia Checklist: Patient identified, Emergency Drugs available, Suction available and Patient being monitored Patient Re-evaluated:Patient Re-evaluated prior to induction Oxygen Delivery Method: Circle System Utilized Preoxygenation: Pre-oxygenation with 100% oxygen Induction Type: IV induction Ventilation: Mask ventilation without difficulty and Oral airway inserted - appropriate to patient size Laryngoscope Size: Mac and 4 Grade View: Grade II Tube type: Oral Number of attempts: 1 Airway Equipment and Method: Stylet and Oral airway Placement Confirmation: ETT inserted through vocal cords under direct vision, positive ETCO2 and breath sounds checked- equal and bilateral Secured at: 23 cm Tube secured with: Tape Dental Injury: Teeth and Oropharynx as per pre-operative assessment

## 2023-02-10 NOTE — Op Note (Signed)
 Post-Op Note/Post-Procedure Note  Patient: Martin Norris MRN: [id number] DOB: 08/09/1950 Sex: male Operation/Procedure Date: 02/10/2023 Surgeons and Role:    * Bryse Blanchette, Cordella LABOR, MD - Primary  Pre-operative Diagnoses: [facility] UMBILICAL HERNIA Postoperative Diagnoses: Same  Procedures:   * OPEN HERNIA REPAIR UMBILICAL ADULT WITH MESH  Anesthesia: General endotracheal anesthesia  Indications: [name] is a 73 y.o. year old male who presents for umbilical hernia repair. Preoperatively, I discussed in detail the risks, benefits, alternatives, and potential complications. The patient understands and requests to proceed.  Operative Findings: Umbilical hernia with fascial defect of 1cm (craniocaudal) x 1cm (transverse)  Technique: The patient was positively identified and was taken to the operating room and placed supine on the operating table. A time-out was performed confirming correct patient and procedure. We also confirmed initiation of deep venous thrombosis prophylaxis and wound prophylaxis. After successful induction of general endotracheal anesthesia, the arms were carefully tucked and padded. The abdomen was prepped and draped in the usual sterile surgical fashion. We began by making a curvilinear incision superior to the umbilicus. We soon encountered the hernia sac. The hernia sac was dissected free from the subcutaneous attachments circumferentially down to the rim of the fascial defect. We were able to maintain a preperitoneal plane circumferentially. The dissected peritoneum was developed for 5cm circumferentially. We ensured hemostasis after this dissection. The patient had fairly healthy fascia circumferentially. This hernia defect was approximately 1-2 cm in size. We ensured hemostasis in the preperitoneal space. A Ventralex ST 4.3 cm mesh was selected and was placed as a sublay within the preperitoneal space with its visceral surface facing the peritoneum. The mesh was  fixated superiorly and inferiorly using 0 PDS suture. The fascia was closed using interrupted 0 Novafil suture after abrading the edges with the Bovie. We then approximated the redundant skin at the umbilicus to the anterior fascia using 3-0 Vicryl suture in a cosmetically appealing fashion. Hemostasis was observed. We then placed 0.25% Marcaine  plain in the subcutaneous tissues. The skin was closed using interrupted 3-0 Vicryl deep dermal sutures followed by a running 4-0 Monocryl subcuticular suture. Dermabond was applied.   The patient tolerated the procedure well and was extubated and taken to recovery room in good condition. All sponge, needle, and instrument counts were reported correct at the end of the case.  Estimated Blood Loss: Minimal. Specimens:None Implants: * No implants in log * Drains: None. Complications: * No complications entered in OR log * Condition of the patient: Good, extubated Disposition: PACU

## 2023-02-10 NOTE — Transfer of Care (Signed)
 Immediate Anesthesia Transfer of Care Note  Patient: Martin Norris  Procedure(s) Performed: OPEN HERNIA REPAIR UMBILICAL ADULT WITH MESH (Abdomen)  Patient Location: PACU  Anesthesia Type:General  Level of Consciousness: awake and patient cooperative  Airway & Oxygen Therapy: Patient Spontanous Breathing and Patient connected to face mask oxygen  Post-op Assessment: Report given to RN and Post -op Vital signs reviewed and stable  Post vital signs: Reviewed and stable  Last Vitals:  Vitals Value Taken Time  BP 119/68 02/10/23 1536  Temp    Pulse 72 02/10/23 1537  Resp 14 02/10/23 1537  SpO2 94 % 02/10/23 1537  Vitals shown include unfiled device data.  Last Pain:  Vitals:   02/10/23 1149  TempSrc: Oral  PainSc: 0-No pain      Patients Stated Pain Goal: 5 (02/10/23 1149)  Complications: No notable events documented.

## 2023-02-10 NOTE — H&P (Signed)
 Martin Norris 08/02/1950  [id number].    HPI:  73 y/o M with an umbilical hernia who presents for elective repair.  He reports that he is in his usual state of health and denies any recent changes in medication.   ROS: Review of Systems  Constitutional: Negative.   HENT: Negative.    Eyes: Negative.   Respiratory: Negative.    Cardiovascular: Negative.   Gastrointestinal: Negative.   Genitourinary: Negative.   Musculoskeletal: Negative.   Skin: Negative.   Neurological: Negative.   Endo/Heme/Allergies: Negative.   Psychiatric/Behavioral: Negative.      Family History  Problem Relation Age of Onset   Healthy Mother    Macular degeneration Mother    Arthritis Mother    Heart disease Father    Stroke Father    Hyperlipidemia Father    COPD Sister    Hypertension Brother    Hyperlipidemia Brother    Hypertension Sister    Hyperlipidemia Brother    Parkinson's disease Paternal Grandfather    Stroke Paternal Grandfather    Heart failure Paternal Grandfather    Colon cancer Neg Hx    Esophageal cancer Neg Hx    Rectal cancer Neg Hx    Stomach cancer Neg Hx     Past Medical History:  Diagnosis Date   Arthritis    [employer]reesia 04/02/2020   Blockage of coronary artery of heart (HCC)    Chronic kidney disease    KIDNEY STONES   Dizziness    GERD (gastroesophageal reflux disease)    Hyperlipidemia    Migraines    Vertigo     Past Surgical History:  Procedure Laterality Date   EYE SURGERY     KNEE ARTHROSCOPY Left    LASIK Bilateral 2000   LEFT HEART CATH AND CORONARY ANGIOGRAPHY N/A 11/23/2019   Procedure: LEFT HEART CATH AND CORONARY ANGIOGRAPHY;  Surgeon: Claudene Victory ORN, MD;  Location: [facility];  Service: Cardiovascular;  Laterality: N/A;    Social History:  reports that he quit smoking about 34 years ago. His smoking use included cigarettes. He started smoking about 64 years ago. He has a 60 pack-year smoking history. He has never used  smokeless tobacco. He reports current alcohol use of about 2.0 standard drinks of alcohol per week. He reports that he does not currently use drugs after having used the following drugs: Marijuana.  Allergies:  Allergies  Allergen Reactions   Codeine     Anxious,intolerence,nervous,mainly cold medicine   Statins Other (See Comments)    Atorvastatin causes MYALGIAS    Medications Prior to Admission  Medication Sig Dispense Refill   aspirin  EC 81 MG tablet Take 1 tablet (81 mg total) by mouth daily. Swallow whole. (Patient taking differently: Take 81 mg by mouth every evening. Swallow whole.)     rosuvastatin  (CRESTOR ) 10 MG tablet Take 1 tablet (10 mg total) by mouth daily. 90 tablet 2   alfuzosin  (UROXATRAL ) 10 MG 24 hr tablet Take 1 tablet (10 mg total) by mouth daily with breakfast. 90 tablet 3   Ibuprofen  (ADVIL  PO) Take 200 mg by mouth 3 (three) times daily as needed (pain). Duo     nitroGLYCERIN  (NITROSTAT ) 0.4 MG SL tablet Place 1 tablet (0.4 mg total) under the tongue every 5 (five) minutes as needed. 25 tablet 3    Physical Exam: Height 5' 7 (1.702 m), weight 102.1 kg. Gen: male resting, NAD Abd: soft, non-distended, umbilical bulge  No results found  for this or any previous visit (from the past 48 hours). No results found.  Assessment/Plan 73 y/o M w/ an umbilical hernia.  - Will proceed to the OR for open repair with mesh. We discussed the alternatives and potential risks of surgery, including but not limited to: bleeding, infection, damage to bowel or surrounding structures, mesh complications, and recurrent hernia. All questions were addressed and consent was obtained.    Cordella DELENA Polly [facility] 02/10/2023, 11:24 AM Please see Amion for pager number during day hours 7:00am-4:30pm or 7:00am -11:30am on weekends

## 2023-02-10 NOTE — Discharge Instructions (Addendum)
 Home Care After Hernia Repair   Activity  Limit activity for the first 24 hours, then you may return to normal daily activities. Returning to normal daily activities as soon as you can following surgery will enhance recovery time.  No heavy lifting pushing or pulling, anything heavier than 10 pounds (gallon of milk weighs approx. 8.8 pounds) for 4-6 weeks from surgery date.  Do not mow the lawn, use a vacuum cleaner, or do any other strenuous activities without first consulting your surgical team.  Climb stairs slowly and watch your step.  Walk as often as you feel able to increase strength and endurance.  No driving or operating heavy machinery within 24 hours of taking narcotic pain medication.  Diet  Drink plenty of fluids and eat a light meal on the night of surgery. Some patients may find their appetite is poor for a week or two after surgery. This is a normal result of the stress of surgery-your appetite will return in time.   There are no specific diet restrictions after surgery.  Dressings and Wound Care  Keep your wound or incision site clean and dry.  You may have different types of dressings covering your incisions depending on your operation and your surgeon: o Dermabond/Durabond (skin glue): This will usually remain in place for 10-14 days, then naturally fall off your skin. You may take a shower 24 hrs after surgery, carefully wash, not scrub the incision site with a mild non-scented soap. Pat dry with a soft towel.  Do not pick or peel skin glue off.  You can shower and let the water fall on the dressings above. Do not soak or submerge your incision(s) in a bath tub, hot tub, or swimming pool, until your doctor says it is ok to do so or the incision(s) have completely healed, usually about 2-4 weeks.  Do not use creams, powder, salves or balms on your incision(s).    What to Expect After Surgery   Moderate discomfort controlled with medications  Minimal drainage from  incision  You may feel pain in one or both shoulders. This pain comes from the gas still left in your belly after the surgery, if you had laparoscopic surgery (several small incisions). The pain should ease over several days to a week. Ambulation will help with this pain.   Belly swelling  Feeling fatigue and weak  Constipation after surgery is common. Drink plenty fluids and eat a high fiber diet.  Swelling - In some patients might feel that their hernia has returned after surgery-DO NOT Worry this is normal. Swelling may be due to the development of a seroma. Seroma is fluid that has built up where the hernia was repaired this is a normal result after surgery and it will slowly reabsorb back into your body over the next several weeks.   Pain Control: Prescribed Non-Narcotic Pain Medication  You will be given three prescriptions.  Two of them will be for prescription strength ibuprofen  (i.e. Advil ) and prescription strength acetaminophen  (i.e. Tylenol ).  The vast majority of patients will just need these two medications.  One prescription will be for a 'rescue' prescription of an oral narcotic (oxycodone ).  You may fill this if needed.  You will alternate taking the ibuprofen  (600mg ) every 6 hours and also the acetaminophen  (650mg ) every 6 hours so that you are taking one of those medications every 3 hours.  For example: o 0800 - take ibuprofen  600mg  o 1100 - take acetaminophen  650mg  o 1400 -  take ibuprofen  600mg  o 1700 - take acetaminophen  650mg  o Etc.  Continue taking this alternating pattern of ibuprofen  and acetaminophen  for 3 days  If you cannot take one or the other of these medications, just take the one you can every 6 hours.  If you are comfortable at night, you don't have to wake up and take a medication.  If you are still uncomfortable after taking either ibuprofen  or acetaminophen , try gentle stretching exercise and ice packs (a bag of frozen vegetables works great).  If you are still  uncomfortable, you may fill the narcotic prescription of Oxycodone  and take as directed.  Once you have completed these prescriptions, your pain level should be low enough to stop taking medications altogether or just use an over the counter medication (ibuprofen  or acetaminophen ) as needed.   Pain Control: Over the Counter Medications to take as needed:  Colace/Docusate: May be prescribed by your surgeon to prevent constipation caused by the combination of narcotics, effects of anesthesia, and decreased ambulation.  Hold for loose stools or diarrhea. Take 100 mg 1-2 times a day starting tonight.   Fiber: High fiber foods, extra liquids (water 9-13 cups/day) can also assist with constipation. Examples of high fiber foods are fruit, bran. Prune juice and water are also good liquids to drink.  Milk of Magnesia/Miralax:  If constipated despite take the over the counter stool softeners, you may take Milk of Magnesia or Miralax as directed on bottle to assist with constipation.     Pepcid/Famotidine: May be prescribed while taking naproxen (Aleve) or other NSAIDs such as ibuprofen  (Motrin /Advil ) to prevent stomach upset or Acid-reflux symptoms. Take 1 tablet 1-2 times a day.   **Constipation: The first bowel movement may occur anywhere between 1-5 days after surgery.  As long as you are not nauseated or not having significant abdominal pain this variation is acceptable.  Narcotic pain medications can cause constipation increasing discomfort; early discontinuation will assist with bowel management.If constipated despite taking stool softeners, you may take Milk of Magnesia or Miralax as directed on the bottle.     **Home medications: You may restart your home medications as directed by your respective Primary Care Physician or Surgeon.  When to notify your Doctor or Healthcare Team:   Sign of Wound Infection   Fever over 100 degrees.  Wound becomes extremely swollen, shows red streaks, warm to the touch,  and/or drainage from the incision site or foul-smelling drainage.  Wound edges separate or opens up  Bleeding or bruising   If you have bleeding, apply pressure to the site and hold the pressure firmly for 5 minutes. If the bleeding continues, apply pressure again and call 911. If the bleeding stopped, call your doctor to report it.   Call your doctor or nurse if you have increased bleeding from your site and increased bruising or a lump forms or gets larger under your skin at the site.  Unrelieved Pain    Call your doctor or nurse if your pain gets worse or is not eased 1 hour after taking your pain medicine, or if it is severe and uncontrolled.  Nausea and Vomiting   Call your doctor or nurse if you have nausea and vomiting that continues more than 24 hours, will not let you keep medicine down and will not let you keep fluids down  Fever, Flu-like symptoms   Fever over 100 degrees and/or chills  Gastrointestinal Bleeding Symptoms    Black tarry bowel movements.  This can be  normal after surgery on the stomach, but should resolve in a day or two.    Call 911 if you suddenly have signs of blood loss such as:  Vomiting blood  Fast heart rate  Feeling faint, sweaty, or blacking out  Passing bright red blood from your rectum  Blood Clot Symptoms   Tender, swollen or reddened areas in your calf muscle or thighs.  Numbness or tingling in your lower leg or calf, or at the top of your leg or groin  Skin on your leg looks pale or blue or feels cold to touch  Chest pain or have trouble breathing, lightheadedness, fast heart rate  Sudden Onset of Symptoms    Call 911 if you suddenly have:  Leg weakness and spasm  Loss of bladder or bowel function  Seizure  Confusion, severe headache, dizziness or feeling unsteady, problems talking, difficulty swallowing, and/or numbness or muscle weakness as these could be signs of a stroke.   Follow up Appointment Your follow up appointment should be  scheduled 2-3 weeks after your surgery date.  If you have not previously scheduled for a follow-up visit you can be scheduled by contacting [phone].  No Tylenol  until 5:50pm

## 2023-02-11 ENCOUNTER — Encounter (HOSPITAL_BASED_OUTPATIENT_CLINIC_OR_DEPARTMENT_OTHER): Payer: Self-pay | Admitting: General Surgery

## 2023-02-11 NOTE — Anesthesia Postprocedure Evaluation (Signed)
 Anesthesia Post Note  Patient: Martin Norris  Procedure(s) Performed: OPEN HERNIA REPAIR UMBILICAL ADULT WITH MESH (Abdomen)     Patient location during evaluation: PACU Anesthesia Type: General Level of consciousness: awake and alert Pain management: pain level controlled Vital Signs Assessment: post-procedure vital signs reviewed and stable Respiratory status: spontaneous breathing, nonlabored ventilation, respiratory function stable and patient connected to nasal cannula oxygen Cardiovascular status: blood pressure returned to baseline and stable Postop Assessment: no apparent nausea or vomiting Anesthetic complications: no   No notable events documented.  Last Vitals:  Vitals:   02/10/23 1600 02/10/23 1615  BP: 124/71 128/72  Pulse: 64   Resp: 11   Temp:  36.5 C  SpO2: 95%             Martin Norris

## 2023-04-01 ENCOUNTER — Encounter: Payer: Self-pay | Admitting: Urology

## 2023-04-01 ENCOUNTER — Ambulatory Visit: Payer: Medicare PPO | Admitting: Urology

## 2023-04-01 VITALS — BP 137/84 | HR 62 | Ht 68.0 in | Wt 226.0 lb

## 2023-04-01 DIAGNOSIS — N401 Enlarged prostate with lower urinary tract symptoms: Secondary | ICD-10-CM

## 2023-04-01 DIAGNOSIS — R351 Nocturia: Secondary | ICD-10-CM

## 2023-04-01 LAB — URINALYSIS, ROUTINE W REFLEX MICROSCOPIC
Bilirubin, UA: NEGATIVE
Glucose, UA: NEGATIVE
Ketones, UA: NEGATIVE
Leukocytes,UA: NEGATIVE
Nitrite, UA: NEGATIVE
RBC, UA: NEGATIVE
Specific Gravity, UA: 1.02 (ref 1.005–1.030)
Urobilinogen, Ur: 0.2 mg/dL (ref 0.2–1.0)
pH, UA: 7 (ref 5.0–7.5)

## 2023-04-01 LAB — MICROSCOPIC EXAMINATION

## 2023-04-01 NOTE — Progress Notes (Signed)
   Assessment: 1. Benign prostatic hyperplasia with nocturia     Plan: Patient elects to stay off medication for now on watchful waiting. Follow-up as needed  Chief Complaint: luts  HPI: Martin Norris is a 73 y.o. male who presents for continued evaluation of BPH with lower urinary tract symptoms as well as nephrolithiasis.  Please see my note 10/22/2022 at the time of initial visit. At that time patient had been started on uroxatrol with excellent response.   CT stone study demonstrated a very tiny nonobstructing right lower pole stone. Approximately 6 weeks ago the patient underwent hernia repair and stopped all of his medications.  He states that he did not restart the Uroxatrol and has not noticed any worsening in his lower urinary tract symptoms.  Current IPSS = 3.  Portions of the above documentation were copied from a prior visit for review purposes only.  Allergies: Allergies  Allergen Reactions   Codeine     Anxious,intolerence,nervous,mainly cold medicine   Statins Other (See Comments)    Atorvastatin causes MYALGIAS    PMH: Past Medical History:  Diagnosis Date   Arthritis    [employer]reesia 04/02/2020   Blockage of coronary artery of heart (HCC)    Chronic kidney disease    KIDNEY STONES   Dizziness    GERD (gastroesophageal reflux disease)    Hyperlipidemia    Migraines    Vertigo     PSH: Past Surgical History:  Procedure Laterality Date   EYE SURGERY     KNEE ARTHROSCOPY Left    LASIK Bilateral 2000   LEFT HEART CATH AND CORONARY ANGIOGRAPHY N/A 11/23/2019   Procedure: LEFT HEART CATH AND CORONARY ANGIOGRAPHY;  Surgeon: Lyn Records, MD;  Location: [facility];  Service: Cardiovascular;  Laterality: N/A;   UMBILICAL HERNIA REPAIR N/A 02/10/2023   Procedure: OPEN HERNIA REPAIR UMBILICAL ADULT WITH MESH;  Surgeon: Moise Boring, MD;  Location: [facility];  Service: General;  Laterality: N/A;    SH: Social History   Tobacco  Use   Smoking status: Former    Current packs/day: 0.00    Average packs/day: 2.0 packs/day for 30.0 years (60.0 ttl pk-yrs)    Types: Cigarettes    Start date: 01/29/1959    Quit date: 01/28/1989    Years since quitting: 34.1   Smokeless tobacco: Never  Vaping Use   Vaping status: Never Used  Substance Use Topics   Alcohol use: Yes    Alcohol/week: 2.0 standard drinks of alcohol    Types: 2 Cans of beer per week    Comment: weekends   Drug use: Not Currently    Types: Marijuana    Comment: Quit 4 years ago    ROS: Constitutional:  Negative for fever, chills, weight loss CV: Negative for chest pain, previous MI, hypertension Respiratory:  Negative for shortness of breath, wheezing, sleep apnea, frequent cough GI:  Negative for nausea, vomiting, bloody stool, GERD  PE: Vitals:   04/01/23 1038  BP: 137/84  Pulse: 62    GENERAL APPEARANCE:  Well appearing, well developed, well nourished, NAD    Results: UA neg for blood or infection

## 2023-04-28 DIAGNOSIS — Z09 Encounter for follow-up examination after completed treatment for conditions other than malignant neoplasm: Secondary | ICD-10-CM | POA: Diagnosis not present

## 2023-05-14 DIAGNOSIS — M1711 Unilateral primary osteoarthritis, right knee: Secondary | ICD-10-CM | POA: Diagnosis not present

## 2023-05-14 DIAGNOSIS — M7121 Synovial cyst of popliteal space [Baker], right knee: Secondary | ICD-10-CM | POA: Diagnosis not present

## 2023-08-06 DIAGNOSIS — M1711 Unilateral primary osteoarthritis, right knee: Secondary | ICD-10-CM | POA: Diagnosis not present

## 2023-10-10 ENCOUNTER — Other Ambulatory Visit: Payer: Self-pay | Admitting: Nurse Practitioner

## 2023-11-24 ENCOUNTER — Ambulatory Visit: Attending: Cardiology | Admitting: Cardiology

## 2023-11-24 ENCOUNTER — Ambulatory Visit (INDEPENDENT_AMBULATORY_CARE_PROVIDER_SITE_OTHER): Payer: Medicare PPO

## 2023-11-24 ENCOUNTER — Encounter: Payer: Self-pay | Admitting: Cardiology

## 2023-11-24 VITALS — BP 138/70 | HR 86 | Temp 98.6°F | Ht 68.0 in | Wt 243.8 lb

## 2023-11-24 VITALS — BP 153/89 | HR 67 | Ht 68.0 in | Wt 242.1 lb

## 2023-11-24 DIAGNOSIS — E785 Hyperlipidemia, unspecified: Secondary | ICD-10-CM | POA: Diagnosis not present

## 2023-11-24 DIAGNOSIS — I2510 Atherosclerotic heart disease of native coronary artery without angina pectoris: Secondary | ICD-10-CM | POA: Diagnosis not present

## 2023-11-24 DIAGNOSIS — Z0000 Encounter for general adult medical examination without abnormal findings: Secondary | ICD-10-CM | POA: Diagnosis not present

## 2023-11-24 MED ORDER — ROSUVASTATIN CALCIUM 10 MG PO TABS: 10.0000 mg | ORAL_TABLET | Freq: Every day | ORAL | 3 refills | Status: AC

## 2023-11-24 MED ORDER — NITROGLYCERIN 0.4 MG SL SUBL: 0.4000 mg | SUBLINGUAL_TABLET | SUBLINGUAL | 3 refills | Status: AC | PRN

## 2023-11-24 NOTE — Progress Notes (Signed)
 Subjective:   Martin Norris is a 73 y.o. who presents for a Medicare Wellness preventive visit.  As a reminder, Annual Wellness Visits don't include a physical exam, and some assessments may be limited, especially if this visit is performed virtually. We may recommend an in-person follow-up visit with your provider if needed.  Visit Complete: In person    Persons Participating in Visit: Patient.  AWV Questionnaire: Yes: Patient Medicare AWV questionnaire was completed by the patient on 11/23/2023; I have confirmed that all information answered by patient is correct and no changes since this date.  Cardiac Risk Factors include: advanced age (>33men, >65 women);dyslipidemia;male gender     Objective:    Today's Vitals   11/24/23 1119  BP: 138/70  Pulse: 86  Temp: 98.6 F (37 C)  TempSrc: Oral  SpO2: 94%  Weight: 243 lb 12.8 oz (110.6 kg)  Height: 5' 8 (1.727 m)   Body mass index is 37.07 kg/m.     11/24/2023   11:24 AM 02/10/2023   11:44 AM 11/18/2022   11:19 AM 09/04/2022   10:36 AM 08/28/2022    8:27 PM 11/23/2019    8:39 AM 05/22/2017   11:03 AM  Advanced Directives  Does Patient Have a Medical Advance Directive? No No No No No No No   Would patient like information on creating a medical advance directive? No - Patient declined No - Patient declined   No - Patient declined No - Patient declined No - Patient declined      Data saved with a previous flowsheet row definition    Current Medications (verified) Outpatient Encounter Medications as of 11/24/2023  Medication Sig   aspirin  EC 81 MG tablet Take 1 tablet (81 mg total) by mouth daily. Swallow whole. (Patient taking differently: Take 81 mg by mouth every evening. Swallow whole.)   Ibuprofen  (ADVIL  PO) Take 200 mg by mouth 3 (three) times daily as needed (pain). Duo   nitroGLYCERIN  (NITROSTAT ) 0.4 MG SL tablet Place 1 tablet (0.4 mg total) under the tongue every 5 (five) minutes as needed.   rosuvastatin   (CRESTOR ) 10 MG tablet Take 1 tablet (10 mg total) by mouth daily.   No facility-administered encounter medications on file as of 11/24/2023.    Allergies (verified) Codeine and Statins   History: Past Medical History:  Diagnosis Date   Arthritis    Phreesia 04/02/2020   Blockage of coronary artery of heart (HCC)    Chronic kidney disease    KIDNEY STONES   Dizziness    GERD (gastroesophageal reflux disease)    Hyperlipidemia    Migraines    Vertigo    Past Surgical History:  Procedure Laterality Date   EYE SURGERY     KNEE ARTHROSCOPY Left    LASIK Bilateral 2000   LEFT HEART CATH AND CORONARY ANGIOGRAPHY N/A 11/23/2019   Procedure: LEFT HEART CATH AND CORONARY ANGIOGRAPHY;  Surgeon: Claudene Victory ORN, MD;  Location: [facility];  Service: Cardiovascular;  Laterality: N/A;   UMBILICAL HERNIA REPAIR N/A 02/10/2023   Procedure: OPEN HERNIA REPAIR UMBILICAL ADULT WITH MESH;  Surgeon: Polly Cordella LABOR, MD;  Location: [facility];  Service: General;  Laterality: N/A;   Family History  Problem Relation Age of Onset   Healthy Mother    Macular degeneration Mother    Arthritis Mother    Heart disease Father    Stroke Father    Hyperlipidemia Father    COPD Sister  Hypertension Brother    Hyperlipidemia Brother    Hypertension Sister    Hyperlipidemia Brother    Parkinson's disease Paternal Grandfather    Stroke Paternal Grandfather    Heart failure Paternal Grandfather    Colon cancer Neg Hx    Esophageal cancer Neg Hx    Rectal cancer Neg Hx    Stomach cancer Neg Hx    Social History   Socioeconomic History   Marital status: Married    Spouse name: Devere   Number of children: 2   Years of education: Not on file   Highest education level: GED or equivalent  Occupational History    Comment: retired Materials Engineer maintenance  Tobacco Use   Smoking status: Former    Current packs/day: 0.00    Average packs/day: 2.0 packs/day for 30.0 years (60.0  ttl pk-yrs)    Types: Cigarettes    Start date: 01/29/1959    Quit date: 01/28/1989    Years since quitting: 34.8   Smokeless tobacco: Never  Vaping Use   Vaping status: Never Used  Substance and Sexual Activity   Alcohol use: Yes    Alcohol/week: 2.0 standard drinks of alcohol    Types: 2 Cans of beer per week    Comment: weekends   Drug use: Not Currently    Types: Marijuana    Comment: Quit 4 years ago   Sexual activity: Not Currently    Birth control/protection: None  Other Topics Concern   Not on file  Social History Narrative   Lives with wife   Caffeine- coffee 1 c, Coke 1 daily, tea 2-3 daily   Social Drivers of Corporate Investment Banker Strain: Low Risk  (11/23/2023)   Overall Financial Resource Strain (CARDIA)    Difficulty of Paying Living Expenses: Not very hard  Food Insecurity: No Food Insecurity (11/23/2023)   Hunger Vital Sign    Worried About Running Out of Food in the Last Year: Never true    Ran Out of Food in the Last Year: Never true  Transportation Needs: No Transportation Needs (11/23/2023)   PRAPARE - Administrator, Civil Service (Medical): No    Lack of Transportation (Non-Medical): No  Physical Activity: Insufficiently Active (11/23/2023)   Exercise Vital Sign    Days of Exercise per Week: 3 days    Minutes of Exercise per Session: 10 min  Stress: No Stress Concern Present (11/23/2023)   [employer] of Occupational Health - Occupational Stress Questionnaire    Feeling of Stress: Only a little  Social Connections: Moderately Isolated (11/23/2023)   Social Connection and Isolation Panel    Frequency of Communication with Friends and Family: More than three times a week    Frequency of Social Gatherings with Friends and Family: More than three times a week    Attends Religious Services: Never    Database Administrator or Organizations: No    Attends Engineer, Structural: Not on file    Marital Status: Married     Tobacco Counseling Counseling given: Not Answered    Clinical Intake:  Pre-visit preparation completed: Yes  Pain : No/denies pain     Nutritional Status: BMI > 30  Obese Nutritional Risks: None Diabetes: No  Lab Results  Component Value Date   HGBA1C 5.4 04/04/2021   HGBA1C 5.5 09/08/2019   HGBA1C 5.5 10/23/2017     How often do you need to have someone help you when you read instructions, pamphlets, or  other written materials from your doctor or pharmacy?: 1 - Never  Interpreter Needed?: No  Information entered by :: NAllen LPN   Activities of Daily Living     11/23/2023   10:09 AM 02/10/2023   11:48 AM  In your present state of health, do you have any difficulty performing the following activities:  Hearing? 0 0  Vision? 0 0  Difficulty concentrating or making decisions? 0 0  Walking or climbing stairs? 0   Dressing or bathing? 0   Doing errands, shopping? 0   Preparing Food and eating ? N   Using the Toilet? N   In the past six months, have you accidently leaked urine? N   Do you have problems with loss of bowel control? N   Managing your Medications? N   Managing your Finances? N   Housekeeping or managing your Housekeeping? N     Patient Care Team: Sebastian Beverley NOVAK, MD as PCP - General (Family Medicine) Jeffrie Oneil BROCKS, MD as PCP - Cardiology (Cardiology)  I have updated your Care Teams any recent Medical Services you may have received from other providers in the past year.     Assessment:   This is a routine wellness examination for Linzie.  Hearing/Vision screen Hearing Screening - Comments:: Denies hearing issues Vision Screening - Comments:: Regular eye exams, [facility]   Goals Addressed             This Visit's Progress    Patient Stated       11/24/2023, denies goals       Depression Screen     11/24/2023   11:25 AM 11/18/2022   11:21 AM 09/04/2022    9:16 AM 04/11/2021    8:45 AM 10/06/2020   10:23 AM 04/05/2020     9:23 AM 12/07/2019   10:34 AM  PHQ 2/9 Scores  PHQ - 2 Score 0 0 0 0 0 1 2  PHQ- 9 Score 2 0 9  2 3 4     Fall Risk     11/23/2023   10:09 AM 11/18/2022   11:20 AM 09/04/2022    9:16 AM 04/11/2021    8:44 AM 10/06/2020   10:23 AM  Fall Risk   Falls in the past year? 0 0 0 0 0  Number falls in past yr: 0 0 0 0 0  Injury with Fall? 0 0 0 0 0  Risk for fall due to : Medication side effect Medication side effect  No Fall Risks No Fall Risks  Follow up Falls prevention discussed;Falls evaluation completed Falls prevention discussed;Falls evaluation completed  Falls evaluation completed  Falls evaluation completed      Data saved with a previous flowsheet row definition    MEDICARE RISK AT HOME:  Medicare Risk at Home Any stairs in or around the home?: (Patient-Rptd) Yes If so, are there any without handrails?: (Patient-Rptd) No Home free of loose throw rugs in walkways, pet beds, electrical cords, etc?: (Patient-Rptd) Yes Adequate lighting in your home to reduce risk of falls?: (Patient-Rptd) Yes Life alert?: (Patient-Rptd) No Use of a cane, walker or w/c?: (Patient-Rptd) No Grab bars in the bathroom?: (Patient-Rptd) No Shower chair or bench in shower?: (Patient-Rptd) No Elevated toilet seat or a handicapped toilet?: (Patient-Rptd) No  TIMED UP AND GO:  Was the test performed?  No  Cognitive Function: 6CIT completed        11/24/2023   11:26 AM 11/18/2022   11:22 AM 04/11/2021  8:45 AM 04/05/2020    9:24 AM  6CIT Screen  What Year? 0 points 0 points 0 points 0 points  What month? 0 points 0 points 0 points 0 points  What time? 0 points 0 points 0 points 0 points  Count back from 20 0 points 0 points 0 points 0 points  Months in reverse 0 points 0 points 0 points 0 points  Repeat phrase 0 points 0 points 0 points 0 points  Total Score 0 points 0 points 0 points 0 points    Immunizations Immunization History  Administered Date(s) Administered   Fluad  Quad(high Dose 65+)  10/06/2020   Fluad  Trivalent(High Dose 65+) 01/16/2023   INFLUENZA, HIGH DOSE SEASONAL PF 12/07/2019   Influenza-Unspecified 11/20/2017   PFIZER(Purple Top)SARS-COV-2 Vaccination 02/18/2019, 03/11/2019, 10/29/2019   Pfizer Covid-19 Vaccine Bivalent Booster 32yrs & up 12/14/2020   Pfizer(Comirnaty )Fall Seasonal Vaccine 12 years and older 01/16/2023   Tdap 05/22/2017    Screening Tests Health Maintenance  Topic Date Due   Pneumococcal Vaccine: 50+ Years (1 of 2 - PCV) Never done   Zoster Vaccines- Shingrix (1 of 2) Never done   Influenza Vaccine  08/29/2023   COVID-19 Vaccine (6 - 2025-26 season) 09/29/2023   Medicare Annual Wellness (AWV)  11/23/2024   DTaP/Tdap/Td (2 - Td or Tdap) 05/23/2027   Colonoscopy  06/03/2030   Hepatitis C Screening  Completed   Meningococcal B Vaccine  Aged Out   Lung Cancer Screening  Discontinued    Health Maintenance Items Addressed: Declines pneumonia and shingles vaccine  Additional Screening:  Vision Screening: Recommended annual ophthalmology exams for early detection of glaucoma and other disorders of the eye. Is the patient up to date with their annual eye exam?  Yes  Who is the provider or what is the name of the office in which the patient attends annual eye exams? [facility]  Dental Screening: Recommended annual dental exams for proper oral hygiene  Community Resource Referral / Chronic Care Management: CRR required this visit?  No   CCM required this visit?  No   Plan:    I have personally reviewed and noted the following in the patient's chart:   Medical and social history Use of alcohol, tobacco or illicit drugs  Current medications and supplements including opioid prescriptions. Patient is not currently taking opioid prescriptions. Functional ability and status Nutritional status Physical activity Advanced directives List of other physicians Hospitalizations, surgeries, and ER visits in previous 12  months Vitals Screenings to include cognitive, depression, and falls Referrals and appointments  In addition, I have reviewed and discussed with patient certain preventive protocols, quality metrics, and best practice recommendations. A written personalized care plan for preventive services as well as general preventive health recommendations were provided to patient.   Ardella FORBES Dawn, LPN   [id number]   After Visit Summary: (In Person-Declined) Patient declined AVS at this time.  Notes: Nothing significant to report at this time.

## 2023-11-24 NOTE — Progress Notes (Signed)
 Cardiology Office Note:  .   Date:  11/24/2023  ID:  Martin Norris, DOB [birth year], MRN [id number] PCP: Sebastian Beverley NOVAK, MD  [facility] Providers Cardiologist:  Oneil Parchment, MD    History of Present Illness: .   Martin Norris is a 73 y.o. male Discussed the use of AI scribe software   History of Present Illness Martin Norris is a 73 year old male with coronary artery disease who presents for follow-up.  He has a history of coronary artery disease with a left heart catheterization in November 2021, revealing a large second obtuse marginal that is functionally occluded, receiving right to left collaterals. There is also residual mid LAD disease with 60-75% stenosis, which is being medically managed. He is currently on aspirin  81 mg and rosuvastatin  10 mg daily, having previously experienced issues with higher doses of statins, specifically Lipitor, but tolerates rosuvastatin  well. A recent nuclear stress test on November 28, 2022, showed no evidence of ischemia, with a very small fixed defect in the basal inferior location.  He underwent an umbilical hernia repair and feels better post-surgery, with some weight gain that he is monitoring. He experienced a bout of tachycardia, evaluated with a normal EKG, and was treated with fluids at the hospital, which improved his condition. He was dehydrated and had been sick with fever for about five to six days. A CT scan showed no pneumonia or pulmonary embolism. He tested negative for COVID-19 and flu, and suspects a tick bite might have been the cause, as he had been bitten several times recently.  He has some issues with his right knee, which has slowed down his activity level. He is reluctant to undergo knee replacement surgery and is currently tolerating the discomfort. He has a home monitor to keep track of his blood pressure. His family has a history of blood pressure issues, and his medication was recently  adjusted.      Studies Reviewed: SABRA   EKG Interpretation Date/Time:  Monday November 24 2023 08:21:30 EDT Ventricular Rate:  67 PR Interval:  190 QRS Duration:  98 QT Interval:  426 QTC Calculation: 450 R Axis:   -15  Text Interpretation: Normal sinus rhythm Minimal voltage criteria for LVH, may be normal variant ( R in aVL ) Nonspecific T wave abnormality When compared with ECG of 05-Nov-2022 14:30, No significant change was found Confirmed by Parchment Oneil ([id number]) on 11/24/2023 8:31:43 AM    Results LABS LDL: 52 Triglycerides: 132 Creatinine: 1.38 ALT: 39  RADIOLOGY Chest CT: No pneumonia. No pulmonary embolism.  DIAGNOSTIC Nuclear stress test: No evidence of ischemia. Very small defect in the basal inferior location fixed. (11/28/2022) EKG: Normal (11/24/2023) Risk Assessment/Calculations:           Physical Exam:   VS:  BP (!) 153/89   Pulse 67   Ht 5' 8 (1.727 m)   Wt 242 lb 1.6 oz (109.8 kg)   SpO2 96%   BMI 36.81 kg/m    Wt Readings from Last 3 Encounters:  11/24/23 242 lb 1.6 oz (109.8 kg)  04/01/23 226 lb (102.5 kg)  02/10/23 224 lb 3.3 oz (101.7 kg)    GEN: Well nourished, well developed in no acute distress NECK: No JVD; No carotid bruits CARDIAC: RRR, no murmurs, no rubs, no gallops RESPIRATORY:  Clear to auscultation without rales, wheezing or rhonchi  ABDOMEN: Soft, non-tender, non-distended EXTREMITIES:  No edema; No deformity   ASSESSMENT AND PLAN: .  Assessment and Plan Assessment & Plan Coronary artery disease of native coronary artery without angina Coronary artery disease is well-managed with no current angina. Previous left heart catheterization in November 2021 showed large second obtuse marginal functionally occluded with right to left collaterals. Residual mid LAD disease (60-75%) is being medically managed. Recent nuclear stress test on 11/28/2022 showed no evidence of ischemia, with a very small fixed defect in the basal inferior  location. - Continue current management without changes. - Encourage maintaining activity level, aiming for 30 minutes of exercise daily.  Essential hypertension Blood pressure is slightly elevated, noted as a trend. He has a home blood pressure monitor and is advised to monitor his blood pressure periodically. If readings consistently show elevated levels (e.g., 150s), medication adjustments may be considered. - Monitor blood pressure at home periodically. - Consider medication adjustments if home readings consistently show elevated levels.  Hyperlipidemia with prior statin intolerance Hyperlipidemia is being managed with rosuvastatin  10 mg daily, which is well tolerated. Previous intolerance to higher doses and to Lipitor (atorvastatin) noted. Current lipid levels are satisfactory, with LDL at 52 and triglycerides at 132. - Continue rosuvastatin  10 mg daily.         Dispo: 1 yr  Signed, Oneil Parchment, MD

## 2023-11-24 NOTE — Patient Instructions (Signed)
 [name],  Thank you for taking the time for your Medicare Wellness Visit. I appreciate your continued commitment to your health goals. Please review the care plan we discussed, and feel free to reach out if I can assist you further.  Medicare recommends these wellness visits once per year to help you and your care team stay ahead of potential health issues. These visits are designed to focus on prevention, allowing your provider to concentrate on managing your acute and chronic conditions during your regular appointments.  Please note that Annual Wellness Visits do not include a physical exam. Some assessments may be limited, especially if the visit was conducted virtually. If needed, we may recommend a separate in-person follow-up with your provider.  Ongoing Care Seeing your primary care provider every 3 to 6 months helps us  monitor your health and provide consistent, personalized care.   Referrals If a referral was made during today's visit and you haven't received any updates within two weeks, please contact the referred provider directly to check on the status.  Recommended Screenings:  Health Maintenance  Topic Date Due   Pneumococcal Vaccine for age over 22 (1 of 2 - PCV) Never done   Zoster (Shingles) Vaccine (1 of 2) Never done   Flu Shot  08/29/2023   COVID-19 Vaccine (6 - 2025-26 season) 09/29/2023   Medicare Annual Wellness Visit  11/23/2024   DTaP/Tdap/Td vaccine (2 - Td or Tdap) 05/23/2027   Colon Cancer Screening  06/03/2030   Hepatitis C Screening  Completed   Meningitis B Vaccine  Aged Out   Screening for Lung Cancer  Discontinued       11/24/2023   11:24 AM  Advanced Directives  Does Patient Have a Medical Advance Directive? No  Would patient like information on creating a medical advance directive? No - Patient declined   Advance Care Planning is important because it: Ensures you receive medical care that aligns with your values, goals, and  preferences. Provides guidance to your family and loved ones, reducing the emotional burden of decision-making during critical moments.  Vision: Annual vision screenings are recommended for early detection of glaucoma, cataracts, and diabetic retinopathy. These exams can also reveal signs of chronic conditions such as diabetes and high blood pressure.  Dental: Annual dental screenings help detect early signs of oral cancer, gum disease, and other conditions linked to overall health, including heart disease and diabetes.  Please see the attached documents for additional preventive care recommendations.

## 2023-11-24 NOTE — Patient Instructions (Signed)
 Medication Instructions:  Your physician recommends that you continue on your current medications as directed. Please refer to the Current Medication list given to you today.  *If you need a refill on your cardiac medications before your next appointment, please call your pharmacy*  Lab Work: none If you have labs (blood work) drawn today and your tests are completely normal, you will receive your results only by: MyChart Message (if you have MyChart) OR A paper copy in the mail If you have any lab test that is abnormal or we need to change your treatment, we will call you to review the results.  Testing/Procedures: none  Follow-Up: At [facility], you and your health needs are our priority.  As part of our continuing mission to provide you with exceptional heart care, our providers are all part of one team.  This team includes your primary Cardiologist (physician) and Advanced Practice Providers or APPs (Physician Assistants and Nurse Practitioners) who all work together to provide you with the care you need, when you need it.  Your next appointment:   12 month(s)  Provider:   Oneil Parchment, MD    We recommend signing up for the patient portal called MyChart.  Sign up information is provided on this After Visit Summary.  MyChart is used to connect with patients for Virtual Visits (Telemedicine).  Patients are able to view lab/test results, encounter notes, upcoming appointments, etc.  Non-urgent messages can be sent to your provider as well.   To learn more about what you can do with MyChart, go to [email or web address].   Other Instructions

## 2023-12-22 DIAGNOSIS — H43811 Vitreous degeneration, right eye: Secondary | ICD-10-CM | POA: Diagnosis not present

## 2023-12-22 DIAGNOSIS — H2513 Age-related nuclear cataract, bilateral: Secondary | ICD-10-CM | POA: Diagnosis not present

## 2024-01-01 ENCOUNTER — Ambulatory Visit: Admitting: Family Medicine

## 2024-01-01 ENCOUNTER — Encounter: Payer: Self-pay | Admitting: Family Medicine

## 2024-01-01 VITALS — BP 130/66 | HR 76 | Temp 97.7°F | Ht 68.0 in | Wt 238.0 lb

## 2024-01-01 DIAGNOSIS — M1711 Unilateral primary osteoarthritis, right knee: Secondary | ICD-10-CM

## 2024-01-01 DIAGNOSIS — I2510 Atherosclerotic heart disease of native coronary artery without angina pectoris: Secondary | ICD-10-CM

## 2024-01-01 DIAGNOSIS — Z6836 Body mass index (BMI) 36.0-36.9, adult: Secondary | ICD-10-CM

## 2024-01-01 DIAGNOSIS — Z8719 Personal history of other diseases of the digestive system: Secondary | ICD-10-CM

## 2024-01-01 DIAGNOSIS — K449 Diaphragmatic hernia without obstruction or gangrene: Secondary | ICD-10-CM

## 2024-01-01 DIAGNOSIS — K219 Gastro-esophageal reflux disease without esophagitis: Secondary | ICD-10-CM

## 2024-01-01 DIAGNOSIS — E7800 Pure hypercholesterolemia, unspecified: Secondary | ICD-10-CM

## 2024-01-01 DIAGNOSIS — R0981 Nasal congestion: Secondary | ICD-10-CM

## 2024-01-01 DIAGNOSIS — N401 Enlarged prostate with lower urinary tract symptoms: Secondary | ICD-10-CM

## 2024-01-01 DIAGNOSIS — M7121 Synovial cyst of popliteal space [Baker], right knee: Secondary | ICD-10-CM

## 2024-01-01 DIAGNOSIS — Z9189 Other specified personal risk factors, not elsewhere classified: Secondary | ICD-10-CM

## 2024-01-01 DIAGNOSIS — Z23 Encounter for immunization: Secondary | ICD-10-CM

## 2024-01-01 NOTE — Progress Notes (Signed)
 Assessment  Assessment/Plan:  Assessment and Plan Assessment & Plan Adult Wellness Visit Annual physical examination conducted. Overall health appears stable with well-managed blood pressure and cholesterol levels. Discussed lifestyle modifications including dietary changes and weight management. - Ordered comprehensive blood work including lipid panel, liver function tests, kidney function tests, magnesium , electrolytes, thyroid function, and PSA. - Scheduled lab visit for blood work. - Scheduled follow-up appointment in two months.  Coronary artery disease without angina Coronary artery disease is well-managed without angina. No recent use of nitroglycerin . Recent cardiology evaluation indicated stable condition. Discussed potential benefits of weight loss medications for heart protection and weight management. - Continue current medications including rosuvastatin  and aspirin .  Hyperlipidemia Managed with rosuvastatin . Previous lipid panel results were satisfactory. Discussed the importance of monitoring cholesterol levels. - Continue rosuvastatin  therapy. - Ordered lipid panel as part of comprehensive blood work.  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 Symptoms are tolerable with current management. Discussed potential for total knee replacement in the future. Current management includes ibuprofen  and diclofenac patches. - Continue current management with ibuprofen  and diclofenac patches. - Will consider total knee replacement if symptoms worsen.  Hiatal hernia, post-repair, with ongoing symptoms Post-repair hiatal hernia with ongoing symptoms of reflux and nasal congestion. Symptoms may be related to silent reflux or allergies. Discussed potential benefits of regular use of heartburn medication and nasal spray. - Prescribed esomeprazole  40 mg for regular use. - Recommended Flonase  nasal spray for nasal  congestion. - Will consider home sleep study to evaluate for sleep apnea.  Umbilical hernia, post-repair Post-repair umbilical hernia with no current issues reported.  Benign prostatic hyperplasia Mild benign prostatic hyperplasia with no current urinary symptoms. Previous PSA levels were normal. - Ordered PSA as part of comprehensive blood work.  Sleep disturbance, possible sleep apnea Sleep disturbance with possible sleep apnea. Symptoms include nasal congestion and difficulty sleeping. Discussed potential causes including reflux and allergies. Consideration of home sleep study to evaluate for sleep apnea. - Ordered home sleep study. - Recommended trial of esomeprazole  and Flonase  to address potential reflux and allergy-related symptoms.  Nasal congestion, possible allergic or reflux-related Nasal congestion possibly related to allergies or reflux. Symptoms include sinus drainage and nasal stuffiness. Discussed potential benefits of nasal spray and heartburn medication. - Recommended Flonase  nasal spray. - Prescribed esomeprazole  40 mg for regular use.       There are no discontinued medications.  Patient Counseling(The following topics were reviewed and/or handout was given):  -Nutrition: Stressed importance of moderation in sodium/caffeine intake, saturated fat and cholesterol, caloric balance, sufficient intake of fresh fruits, vegetables, and fiber.  -Stressed the importance of regular exercise.   -Substance Abuse: Discussed cessation/primary prevention of tobacco, alcohol, or other drug use; driving or other dangerous activities under the influence; availability of treatment for abuse.   -Injury prevention: Discussed safety belts, safety helmets, smoke detector, smoking near bedding or upholstery.   -Sexuality: Discussed sexually transmitted diseases, partner selection, use of condoms, avoidance of unintended pregnancy and contraceptive alternatives.   -Dental health:  Discussed importance of regular tooth brushing, flossing, and dental visits.  -Health maintenance and immunizations reviewed. Please refer to Health maintenance section.  No follow-ups on file.        Subjective:   Encounter date: 01/01/2024  Chief Complaint  Patient presents with   Annual Exam    Physical   Discussed the use of AI scribe software for clinical note transcription with the patient,  who gave verbal consent to proceed.  History of Present Illness Martin Norris is a 73 year old male with hyperlipidemia, coronary artery disease, and hiatal hernia who presents for an annual physical exam.  Sleep disturbance and nocturnal nasal congestion - Ongoing sleep issues for the past 4-6 months - Averages approximately 5 hours of sleep per night - Nocturnal nasal congestion disrupts sleep; sensation of head feeling 'stopped up' at night - Claritin provides some relief but is not used consistently - No snoring or episodes of waking up gasping for air  Gastroesophageal reflux and hiatal hernia symptoms - Persistent reflux symptoms described as severe when he occurs - History of hiatal hernia repair earlier this year - Takes esomeprazole  as needed for symptoms, not on a regular schedule - No mention of dysphagia, odynophagia, or gastrointestinal bleeding  Coronary artery disease and exertional dyspnea - History of five coronary artery blockages, one at 80% - Shortness of breath with exertion, but not with mild activities such as walking to the mailbox - No recent use of nitroglycerin  - Regular follow-up with cardiology; last visit approximately one week ago  Right knee pain and osteoarthritis - Chronic right knee osteoarthritis with recurrent Baker's cysts - Follows with orthopedics and is considering total knee replacement - Uses ibuprofen  and diclofenac patches for pain management - Receives intra-articular injections approximately every six months  Hyperlipidemia  and medication management - Hyperlipidemia managed with rosuvastatin  10 mg daily - Coronary artery disease managed with aspirin  and rosuvastatin   Lower urinary tract symptoms - History of mild benign prostatic hyperplasia (BPH) - No mention of current urinary symptoms        11/24/2023   11:25 AM 11/18/2022   11:21 AM 09/04/2022    9:16 AM 04/11/2021    8:45 AM 10/06/2020   10:23 AM  Depression screen PHQ 2/9  Decreased Interest 0 0 0 0 0  Down, Depressed, Hopeless 0 0 0 0 0  PHQ - 2 Score 0 0 0 0 0  Altered sleeping 1 0 3  1  Tired, decreased energy 1 0 3  1  Change in appetite 0 0 3  0  Feeling bad or failure about yourself  0 0 0  0  Trouble concentrating 0 0 0  0  Moving slowly or fidgety/restless 0 0 0  0  Suicidal thoughts 0 0 0  0  PHQ-9 Score 2  0  9   2   Difficult doing work/chores Not difficult at all Not difficult at all Not difficult at all  Not difficult at all     Data saved with a previous flowsheet row definition       09/04/2022    9:17 AM 10/06/2020   10:24 AM  GAD 7 : Generalized Anxiety Score  Nervous, Anxious, on Edge 0 0  Control/stop worrying 0 0  Worry too much - different things 0 0  Trouble relaxing 3 0  Restless 2 0  Easily annoyed or irritable 2 0  Afraid - awful might happen 0 0  Total GAD 7 Score 7 0  Anxiety Difficulty Not difficult at all     Health Maintenance Due  Topic Date Due   Pneumococcal Vaccine: 50+ Years (1 of 2 - PCV) Never done   Zoster Vaccines- Shingrix (1 of 2) Never done   Influenza Vaccine  08/29/2023   COVID-19 Vaccine (6 - 2025-26 season) 09/29/2023   PMH:  The following were reviewed and entered/updated in epic: Past  Medical History:  Diagnosis Date   Arthritis    Phreesia 04/02/2020   Blockage of coronary artery of heart (HCC)    Chronic kidney disease    KIDNEY STONES   Dizziness    GERD (gastroesophageal reflux disease)    Hyperlipidemia    Migraines    Vertigo     Patient Active Problem List    Diagnosis Date Noted   Benign prostatic hyperplasia with nocturia 09/22/2022   Nephrolithiasis 09/22/2022   Hiatal hernia 09/22/2022   Umbilical hernia without obstruction and without gangrene 09/22/2022   Low back pain 09/22/2022   Synovial cyst of right popliteal space 01/14/2020   Osteoarthritis of knee 01/03/2020   CAD (coronary artery disease), native coronary artery 11/23/2019   Angina pectoris 11/23/2019   Statin intolerance    Pain in right knee 05/12/2019   Healthcare maintenance 05/22/2017   Hyperlipidemia 05/22/2017   Hallux limitus of right foot 02/11/2017    Past Surgical History:  Procedure Laterality Date   EYE SURGERY     KNEE ARTHROSCOPY Left    LASIK Bilateral 2000   LEFT HEART CATH AND CORONARY ANGIOGRAPHY N/A 11/23/2019   Procedure: LEFT HEART CATH AND CORONARY ANGIOGRAPHY;  Surgeon: Claudene Victory ORN, MD;  Location: [facility];  Service: Cardiovascular;  Laterality: N/A;   UMBILICAL HERNIA REPAIR N/A 02/10/2023   Procedure: OPEN HERNIA REPAIR UMBILICAL ADULT WITH MESH;  Surgeon: Polly Cordella LABOR, MD;  Location: [facility];  Service: General;  Laterality: N/A;    Family History  Problem Relation Age of Onset   Healthy Mother    Macular degeneration Mother    Arthritis Mother    Heart disease Father    Stroke Father    Hyperlipidemia Father    COPD Sister    Hypertension Brother    Hyperlipidemia Brother    Hypertension Sister    Hyperlipidemia Brother    Parkinson's disease Paternal Grandfather    Stroke Paternal Grandfather    Heart failure Paternal Grandfather    Colon cancer Neg Hx    Esophageal cancer Neg Hx    Rectal cancer Neg Hx    Stomach cancer Neg Hx     Medications- reviewed and updated Outpatient Medications Prior to Visit  Medication Sig Dispense Refill   aspirin  EC 81 MG tablet Take 1 tablet (81 mg total) by mouth daily. Swallow whole. (Patient taking differently: Take 81 mg by mouth every evening. Swallow  whole.)     Ibuprofen  (ADVIL  PO) Take 200 mg by mouth 3 (three) times daily as needed (pain). Duo     nitroGLYCERIN  (NITROSTAT ) 0.4 MG SL tablet Place 1 tablet (0.4 mg total) under the tongue every 5 (five) minutes as needed. 25 tablet 3   rosuvastatin  (CRESTOR ) 10 MG tablet Take 1 tablet (10 mg total) by mouth daily. 90 tablet 3   No facility-administered medications prior to visit.     Allergies  Allergen Reactions   Codeine     Anxious,intolerence,nervous,mainly cold medicine   Statins Other (See Comments)    Atorvastatin causes MYALGIAS    Social History   Socioeconomic History   Marital status: Married    Spouse name: Devere   Number of children: 2   Years of education: Not on file   Highest education level: GED or equivalent  Occupational History    Comment: retired Materials Engineer maintenance  Tobacco Use   Smoking status: Former    Current packs/day: 0.00    Average packs/day:  2.0 packs/day for 30.0 years (60.0 ttl pk-yrs)    Types: Cigarettes    Start date: 01/29/1959    Quit date: 01/28/1989    Years since quitting: 34.9   Smokeless tobacco: Never  Vaping Use   Vaping status: Never Used  Substance and Sexual Activity   Alcohol use: Yes    Alcohol/week: 2.0 standard drinks of alcohol    Types: 2 Cans of beer per week    Comment: weekends   Drug use: Not Currently    Types: Marijuana    Comment: Quit 4 years ago   Sexual activity: Not Currently    Birth control/protection: None  Other Topics Concern   Not on file  Social History Narrative   Lives with wife   Caffeine- coffee 1 c, Coke 1 daily, tea 2-3 daily   Social Drivers of Corporate Investment Banker Strain: Low Risk  (11/23/2023)   Overall Financial Resource Strain (CARDIA)    Difficulty of Paying Living Expenses: Not very hard  Food Insecurity: No Food Insecurity (11/23/2023)   Hunger Vital Sign    Worried About Running Out of Food in the Last Year: Never true    Ran Out of Food in the Last Year: Never  true  Transportation Needs: No Transportation Needs (11/23/2023)   PRAPARE - Administrator, Civil Service (Medical): No    Lack of Transportation (Non-Medical): No  Physical Activity: Insufficiently Active (11/23/2023)   Exercise Vital Sign    Days of Exercise per Week: 3 days    Minutes of Exercise per Session: 10 min  Stress: No Stress Concern Present (11/23/2023)   [employer] of Occupational Health - Occupational Stress Questionnaire    Feeling of Stress: Only a little  Social Connections: Moderately Isolated (11/23/2023)   Social Connection and Isolation Panel    Frequency of Communication with Friends and Family: More than three times a week    Frequency of Social Gatherings with Friends and Family: More than three times a week    Attends Religious Services: Never    Database Administrator or Organizations: No    Attends Engineer, Structural: Not on file    Marital Status: Married           Objective:  Physical Exam: BP 130/66   Pulse 76   Temp 97.7 F (36.5 C)   Ht 5' 8 (1.727 m)   Wt 238 lb (108 kg)   SpO2 96%   BMI 36.19 kg/m   There is no height or weight on file to calculate BMI. Wt Readings from Last 3 Encounters:  11/24/23 243 lb 12.8 oz (110.6 kg)  11/24/23 242 lb 1.6 oz (109.8 kg)  04/01/23 226 lb (102.5 kg)   Physical Exam Constitutional:      General: He is not in acute distress.    Appearance: Normal appearance. He is not ill-appearing or toxic-appearing.  HENT:     Head: Normocephalic and atraumatic.     Right Ear: Hearing, tympanic membrane, ear canal and external ear normal. There is no impacted cerumen.     Left Ear: Hearing, tympanic membrane, ear canal and external ear normal. There is no impacted cerumen.     Nose: Nose normal. No congestion.     Mouth/Throat:     Lips: No lesions.     Mouth: Mucous membranes are moist.     Pharynx: Oropharynx is clear. No oropharyngeal exudate.  Eyes:     General: No  scleral icterus.       Right eye: No discharge.        Left eye: No discharge.     Conjunctiva/sclera: Conjunctivae normal.     Pupils: Pupils are equal, round, and reactive to light.  Neck:     Thyroid: No thyroid mass, thyromegaly or thyroid tenderness.  Cardiovascular:     Rate and Rhythm: Normal rate and regular rhythm.     Pulses: Normal pulses.     Heart sounds: Normal heart sounds.  Pulmonary:     Effort: Pulmonary effort is normal. No respiratory distress.     Breath sounds: Normal breath sounds.  Abdominal:     General: Abdomen is flat. Bowel sounds are normal.     Palpations: Abdomen is soft.  Musculoskeletal:        General: Normal range of motion.     Cervical back: Normal range of motion.     Right lower leg: No edema.     Left lower leg: No edema.  Lymphadenopathy:     Cervical: No cervical adenopathy.  Skin:    General: Skin is warm and dry.     Findings: No rash.  Neurological:     General: No focal deficit present.     Mental Status: He is alert and oriented to person, place, and time. Mental status is at baseline.     Deep Tendon Reflexes:     Reflex Scores:      Patellar reflexes are 2+ on the right side and 2+ on the left side. Psychiatric:        Mood and Affect: Mood normal.        Behavior: Behavior normal.        Thought Content: Thought content normal.        Judgment: Judgment normal.         Prior labs:   No results found for this or any previous visit (from the past 2160 hours).  Lab Results  Component Value Date   CHOL 110 11/28/2022   CHOL 135 04/04/2021   CHOL 130 04/13/2020   Lab Results  Component Value Date   HDL 35 (L) 11/28/2022   HDL 41 04/04/2021   HDL 41 04/13/2020   Lab Results  Component Value Date   LDLCALC 52 11/28/2022   LDLCALC 65 04/04/2021   LDLCALC 66 04/13/2020   Lab Results  Component Value Date   TRIG 132 11/28/2022   TRIG 175 (H) 04/04/2021   TRIG 129 04/13/2020   Lab Results  Component Value  Date   CHOLHDL 3.1 11/28/2022   CHOLHDL 3.3 04/04/2021   CHOLHDL 3.2 04/13/2020   No results found for: LDLDIRECT  Last metabolic panel Lab Results  Component Value Date   GLUCOSE 124 (H) 09/04/2022   NA 131 (L) 09/04/2022   K 4.2 09/04/2022   CL 94 (L) 09/04/2022   CO2 24 09/04/2022   BUN 20 09/04/2022   CREATININE 1.38 (H) 09/04/2022   GFRNONAA 55 (L) 09/04/2022   CALCIUM  8.2 (L) 09/04/2022   PROT 5.9 (L) 11/28/2022   ALBUMIN 4.0 11/28/2022   LABGLOB 2.2 04/04/2021   AGRATIO 1.9 04/04/2021   BILITOT 0.6 11/28/2022   ALKPHOS 61 11/28/2022   AST 27 11/28/2022   ALT 22 11/28/2022   ANIONGAP 13 09/04/2022    Lab Results  Component Value Date   HGBA1C 5.4 04/04/2021    Last CBC Lab Results  Component Value Date   WBC 13.6 (H) 09/04/2022  HGB 14.6 09/04/2022   HCT 43.2 09/04/2022   MCV 89.1 09/04/2022   MCH 30.1 09/04/2022   RDW 14.4 09/04/2022   PLT 257 09/04/2022    Lab Results  Component Value Date   TSH 2.160 04/04/2021    Lab Results  Component Value Date   PSA 2.0 12/24/2016    Last vitamin D  Lab Results  Component Value Date   VD25OH 20 12/24/2016    Lab Results  Component Value Date   COLORU Yellow 04/01/2023   BILIRUBINUR Negative 04/01/2023   KETONESU Negative 04/01/2023   SPECGRAV 1.020 04/01/2023   PHUR 7.0 04/01/2023   PROTEINUR 1+ (A) 04/01/2023   LEUKOCYTESUR Negative 04/01/2023    Lab Results  Component Value Date   LABMICR See below: 04/01/2023     At today's visit, we discussed treatment options, associated risk and benefits, and engage in counseling as needed.  Additionally the following were reviewed: Past medical records, past medical and surgical history, family and social background, as well as relevant laboratory results, imaging findings, and specialty notes, where applicable.  This message was generated using dictation software, and as a result, it may contain unintentional typos or errors.  Nevertheless,  extensive effort was made to accurately convey at the pertinent aspects of the patient visit.    There may have been are other unrelated non-urgent complaints, but due to the busy schedule and the amount of time already spent with him, time does not permit to address these issues at today's visit. Another appointment may have or has been requested to review these additional issues.     Beverley KATHEE Hummer, MD  I,Emily Lagle,acting as a scribe for Beverley KATHEE Hummer, MD.,have documented all relevant documentation on the behalf of Beverley KATHEE Hummer, MD.  LILLETTE Beverley KATHEE Hummer, MD, have reviewed all documentation for this visit. The documentation on 01/01/2024 for the exam, diagnosis, procedures, and orders are all accurate and complete.

## 2024-01-04 DIAGNOSIS — R0981 Nasal congestion: Secondary | ICD-10-CM | POA: Insufficient documentation

## 2024-01-04 DIAGNOSIS — Z8719 Personal history of other diseases of the digestive system: Secondary | ICD-10-CM | POA: Insufficient documentation

## 2024-01-04 DIAGNOSIS — M7121 Synovial cyst of popliteal space [Baker], right knee: Secondary | ICD-10-CM | POA: Insufficient documentation

## 2024-01-04 DIAGNOSIS — K219 Gastro-esophageal reflux disease without esophagitis: Secondary | ICD-10-CM | POA: Insufficient documentation

## 2024-01-04 MED ORDER — ESOMEPRAZOLE MAGNESIUM 40 MG PO CPDR: 40.0000 mg | DELAYED_RELEASE_CAPSULE | Freq: Every day | ORAL | 3 refills | Status: AC

## 2024-01-04 MED ORDER — FLUTICASONE PROPIONATE 50 MCG/ACT NA SUSP: 2.0000 | Freq: Every day | NASAL | 6 refills | Status: AC

## 2024-01-04 NOTE — Addendum Note (Signed)
 Addended by: SEBASTIAN RIGHTER B on: 01/04/2024 09:10 PM   Modules accepted: Orders

## 2024-01-15 ENCOUNTER — Other Ambulatory Visit

## 2024-01-15 DIAGNOSIS — E66812 Obesity, class 2: Secondary | ICD-10-CM

## 2024-01-15 DIAGNOSIS — Z6836 Body mass index (BMI) 36.0-36.9, adult: Secondary | ICD-10-CM | POA: Diagnosis not present

## 2024-01-15 LAB — CBC WITH DIFFERENTIAL/PLATELET
Basophils Absolute: 0.1 K/uL (ref 0.0–0.1)
Basophils Relative: 1.2 % (ref 0.0–3.0)
Eosinophils Absolute: 0.2 K/uL (ref 0.0–0.7)
Eosinophils Relative: 5 % (ref 0.0–5.0)
HCT: 46.2 % (ref 39.0–52.0)
Hemoglobin: 15.9 g/dL (ref 13.0–17.0)
Lymphocytes Relative: 29.8 % (ref 12.0–46.0)
Lymphs Abs: 1.4 K/uL (ref 0.7–4.0)
MCHC: 34.4 g/dL (ref 30.0–36.0)
MCV: 87.8 fl (ref 78.0–100.0)
Monocytes Absolute: 0.4 K/uL (ref 0.1–1.0)
Monocytes Relative: 8.6 % (ref 3.0–12.0)
Neutro Abs: 2.7 K/uL (ref 1.4–7.7)
Neutrophils Relative %: 55.4 % (ref 43.0–77.0)
Platelets: 209 K/uL (ref 150.0–400.0)
RBC: 5.26 Mil/uL (ref 4.22–5.81)
RDW: 13.3 % (ref 11.5–15.5)
WBC: 4.8 K/uL (ref 4.0–10.5)

## 2024-01-15 LAB — COMPREHENSIVE METABOLIC PANEL WITH GFR
ALT: 20 U/L (ref 3–53)
AST: 24 U/L (ref 5–37)
Albumin: 4.1 g/dL (ref 3.5–5.2)
Alkaline Phosphatase: 58 U/L (ref 39–117)
BUN: 21 mg/dL (ref 6–23)
CO2: 30 meq/L (ref 19–32)
Calcium: 8.9 mg/dL (ref 8.4–10.5)
Chloride: 102 meq/L (ref 96–112)
Creatinine, Ser: 1.06 mg/dL (ref 0.40–1.50)
GFR: 70.86 mL/min (ref >60.00)
Glucose, Bld: 104 mg/dL — ABNORMAL HIGH (ref 70–99)
Potassium: 4.5 meq/L (ref 3.5–5.1)
Sodium: 139 meq/L (ref 135–145)
Total Bilirubin: 0.6 mg/dL (ref 0.2–1.2)
Total Protein: 6.4 g/dL (ref 6.0–8.3)

## 2024-01-15 LAB — LIPID PANEL
Cholesterol: 127 mg/dL (ref 28–200)
HDL: 38.9 mg/dL — ABNORMAL LOW (ref >39.00)
LDL Cholesterol: 66 mg/dL (ref 10–99)
NonHDL: 87.72
Total CHOL/HDL Ratio: 3
Triglycerides: 108 mg/dL (ref 10.0–149.0)
VLDL: 21.6 mg/dL (ref 0.0–40.0)

## 2024-01-15 LAB — MICROALBUMIN / CREATININE URINE RATIO
Creatinine,U: 141.5 mg/dL
Microalb Creat Ratio: 23.9 mg/g (ref 0.0–30.0)
Microalb, Ur: 3.4 mg/dL — ABNORMAL HIGH (ref 0.7–1.9)

## 2024-01-15 LAB — TSH: TSH: 2.32 u[IU]/mL (ref 0.35–5.50)

## 2024-01-15 LAB — HEMOGLOBIN A1C: Hgb A1c MFr Bld: 5.5 % (ref 4.6–6.5)

## 2024-01-23 ENCOUNTER — Ambulatory Visit: Payer: Self-pay | Admitting: Family Medicine

## 2024-01-23 DIAGNOSIS — N401 Enlarged prostate with lower urinary tract symptoms: Secondary | ICD-10-CM

## 2024-02-03 NOTE — Telephone Encounter (Signed)
 Dr. Sebastian, please place order for PSA

## 2024-02-06 ENCOUNTER — Other Ambulatory Visit

## 2024-02-06 DIAGNOSIS — E66812 Obesity, class 2: Secondary | ICD-10-CM | POA: Diagnosis not present

## 2024-02-06 DIAGNOSIS — N401 Enlarged prostate with lower urinary tract symptoms: Secondary | ICD-10-CM

## 2024-02-06 DIAGNOSIS — R351 Nocturia: Secondary | ICD-10-CM | POA: Diagnosis not present

## 2024-02-06 DIAGNOSIS — Z6836 Body mass index (BMI) 36.0-36.9, adult: Secondary | ICD-10-CM

## 2024-02-06 LAB — PSA: PSA: 4.42 ng/mL — ABNORMAL HIGH (ref 0.10–4.00)

## 2024-02-07 LAB — URINALYSIS W MICROSCOPIC + REFLEX CULTURE
Bacteria, UA: NONE SEEN /HPF
Bilirubin Urine: NEGATIVE
Glucose, UA: NEGATIVE
Hgb urine dipstick: NEGATIVE
Hyaline Cast: NONE SEEN /LPF
Ketones, ur: NEGATIVE
Leukocyte Esterase: NEGATIVE
Nitrites, Initial: NEGATIVE
RBC / HPF: NONE SEEN /HPF (ref 0–2)
Specific Gravity, Urine: 1.023 (ref 1.001–1.035)
Squamous Epithelial / HPF: NONE SEEN /HPF
WBC, UA: NONE SEEN /HPF (ref 0–5)
pH: 5.5 (ref 5.0–8.0)

## 2024-02-07 LAB — NO CULTURE INDICATED

## 2024-02-13 NOTE — Telephone Encounter (Signed)
 Last read by Elspeth JONETTA Waddell Marcey at 4:39PM on 02/12/2024.

## 2024-11-29 ENCOUNTER — Ambulatory Visit
